# Patient Record
Sex: Female | Born: 1985 | Race: Black or African American | Hispanic: No | Marital: Single | Smoking: Current every day smoker
Health system: Southern US, Community
[De-identification: ages and names within clinical notes are randomized; demographics above are authoritative.]

---

## 2013-09-07 ENCOUNTER — Encounter (HOSPITAL_BASED_OUTPATIENT_CLINIC_OR_DEPARTMENT_OTHER): Payer: Self-pay | Admitting: Emergency Medicine

## 2013-09-07 ENCOUNTER — Emergency Department (HOSPITAL_BASED_OUTPATIENT_CLINIC_OR_DEPARTMENT_OTHER)
Admission: EM | Admit: 2013-09-07 | Discharge: 2013-09-07 | Disposition: A | Discharge status: Home / Self Care | Payer: BC Managed Care – PPO | Attending: Emergency Medicine | Admitting: Emergency Medicine

## 2013-09-07 DIAGNOSIS — B349 Viral infection, unspecified: Secondary | ICD-10-CM

## 2013-09-07 DIAGNOSIS — J029 Acute pharyngitis, unspecified: Secondary | ICD-10-CM

## 2013-09-07 DIAGNOSIS — B9789 Other viral agents as the cause of diseases classified elsewhere: Secondary | ICD-10-CM | POA: Insufficient documentation

## 2013-09-07 LAB — RAPID STREP SCREEN (MED CTR MEBANE ONLY): STREPTOCOCCUS, GROUP A SCREEN (DIRECT): NEGATIVE

## 2013-09-07 MED ORDER — IBUPROFEN 600 MG PO TABS: 600.0000 mg | ORAL_TABLET | Freq: Four times a day (QID) | ORAL | Status: DC | PRN

## 2013-09-07 MED ORDER — GUAIFENESIN-CODEINE 100-10 MG/5ML PO SYRP: 5.0000 mL | ORAL_SOLUTION | Freq: Three times a day (TID) | ORAL | Status: DC | PRN

## 2013-09-07 NOTE — ED Notes (Addendum)
Sore throat since yesterday.

## 2013-09-07 NOTE — ED Provider Notes (Signed)
CSN: [id number]     Arrival date & time 09/07/13  1539 History   First MD Initiated Contact with Patient 09/07/13 1626     Chief Complaint  Patient presents with  . Sore Throat     (Consider location/radiation/quality/duration/timing/severity/associated sxs/prior Treatment) Patient is a 28 y.o. female presenting with pharyngitis. The history is provided by the patient.  Sore Throat This is a new problem. The current episode started yesterday. The problem occurs constantly. The problem has been gradually worsening. Associated symptoms include congestion, coughing, a sore throat and swollen glands. Pertinent negatives include no abdominal pain, chills, fatigue, fever, nausea, rash, urinary symptoms, vertigo, visual change or vomiting. The symptoms are aggravated by coughing. Treatments tried: OTC cough and cold medication. The treatment provided moderate relief.   Stacy Franklin is a 28 y.o. female who presents to the ED with sore throat, congestion and cough that started yesterday. She has taken Benadryl, Claritin and cough medication. Every thing got better except the sore throat.    History reviewed. No pertinent past medical history. History reviewed. No pertinent past surgical history. No family history on file. History  Substance Use Topics  . Smoking status: Never Smoker   . Smokeless tobacco: Not on file  . Alcohol Use: No   OB History   Grav Para Term Preterm Abortions TAB SAB Ect Mult Living                 Review of Systems  Constitutional: Negative for fever, chills and fatigue.  HENT: Positive for congestion, sinus pressure and sore throat. Negative for ear pain.   Respiratory: Positive for cough. Negative for wheezing.   Gastrointestinal: Negative for nausea, vomiting and abdominal pain.  Genitourinary: Negative for dysuria, urgency and frequency.  Musculoskeletal: Negative for back pain.  Skin: Negative for rash.  Neurological: Negative for vertigo, syncope and  light-headedness.  Psychiatric/Behavioral: Negative for confusion. The patient is not nervous/anxious.       Allergies  Review of patient's allergies indicates no known allergies.  Home Medications   Prior to Admission medications   Not on File   BP 125/66  Pulse 86  Temp(Src) 98.1 F (36.7 C) (Oral)  Resp 18  Ht 5\' 6"  (1.676 m)  Wt 252 lb (114.306 kg)  BMI 40.69 kg/m2  SpO2 99%  LMP 08/27/2013 Physical Exam  Nursing note and vitals reviewed. Constitutional: She is oriented to person, place, and time. She appears well-developed and well-nourished.  HENT:  Head: Normocephalic.  Right Ear: Tympanic membrane normal.  Left Ear: Tympanic membrane normal.  Nose: Nose normal.  Mouth/Throat: Uvula is midline and mucous membranes are normal. Posterior oropharyngeal erythema present.  Eyes: Conjunctivae and EOM are normal.  Neck: Neck supple.  Cardiovascular: Normal rate and regular rhythm.   Pulmonary/Chest: Effort normal and breath sounds normal.  Abdominal: Soft. There is no tenderness.  Musculoskeletal: Normal range of motion.  Lymphadenopathy:    She has no cervical adenopathy.  Neurological: She is alert and oriented to person, place, and time. No cranial nerve deficit.  Skin: Skin is warm and dry.  Psychiatric: She has a normal mood and affect. Her behavior is normal.    ED Course  Procedures (including critical care time) Labs Review Results for orders placed during the hospital encounter of 09/07/13 (from the past 24 hour(s))  RAPID STREP SCREEN     Status: None   Collection Time    09/07/13  3:45 PM      Result Value Ref  Range   Streptococcus, Group A Screen (Direct) NEGATIVE  NEGATIVE     MDM  28 y.o. female with sore throat that started yesterday. No fever or other problems today. Rapid strep negative. Will treat as viral pharyngitis. She will follow up with her PCP or return here as needed.  Discussed with the patient and all questioned fully answered.      [facility] Bunnie Pion, [place] 09/08/13 [phone]

## 2013-09-09 LAB — CULTURE, GROUP A STREP

## 2013-09-10 NOTE — ED Provider Notes (Signed)
Medical screening examination/treatment/procedure(s) were performed by non-physician practitioner and as supervising physician I was immediately available for consultation/collaboration.   EKG Interpretation None        Delice Bison Dominie Benedick, DO 09/10/13 [phone]

## 2013-10-28 ENCOUNTER — Ambulatory Visit
Admission: RE | Admit: 2013-10-28 | Discharge: 2013-10-28 | Disposition: A | Discharge status: Home / Self Care | Payer: BC Managed Care – PPO | Source: Ambulatory Visit | Attending: Family Medicine | Admitting: Family Medicine

## 2013-10-28 ENCOUNTER — Other Ambulatory Visit: Payer: Self-pay | Admitting: Family Medicine

## 2013-10-28 DIAGNOSIS — M25561 Pain in right knee: Secondary | ICD-10-CM

## 2015-12-03 IMAGING — CR DG KNEE 1-2V*R*
2 series · 2 of 2 positions shown · non-contrast
Comparison: None.

CLINICAL DATA: Right knee pain, accident 1 year ago, episodic pain

EXAM:
RIGHT KNEE - 1-2 VIEW

[view not recorded (1 of 2)]
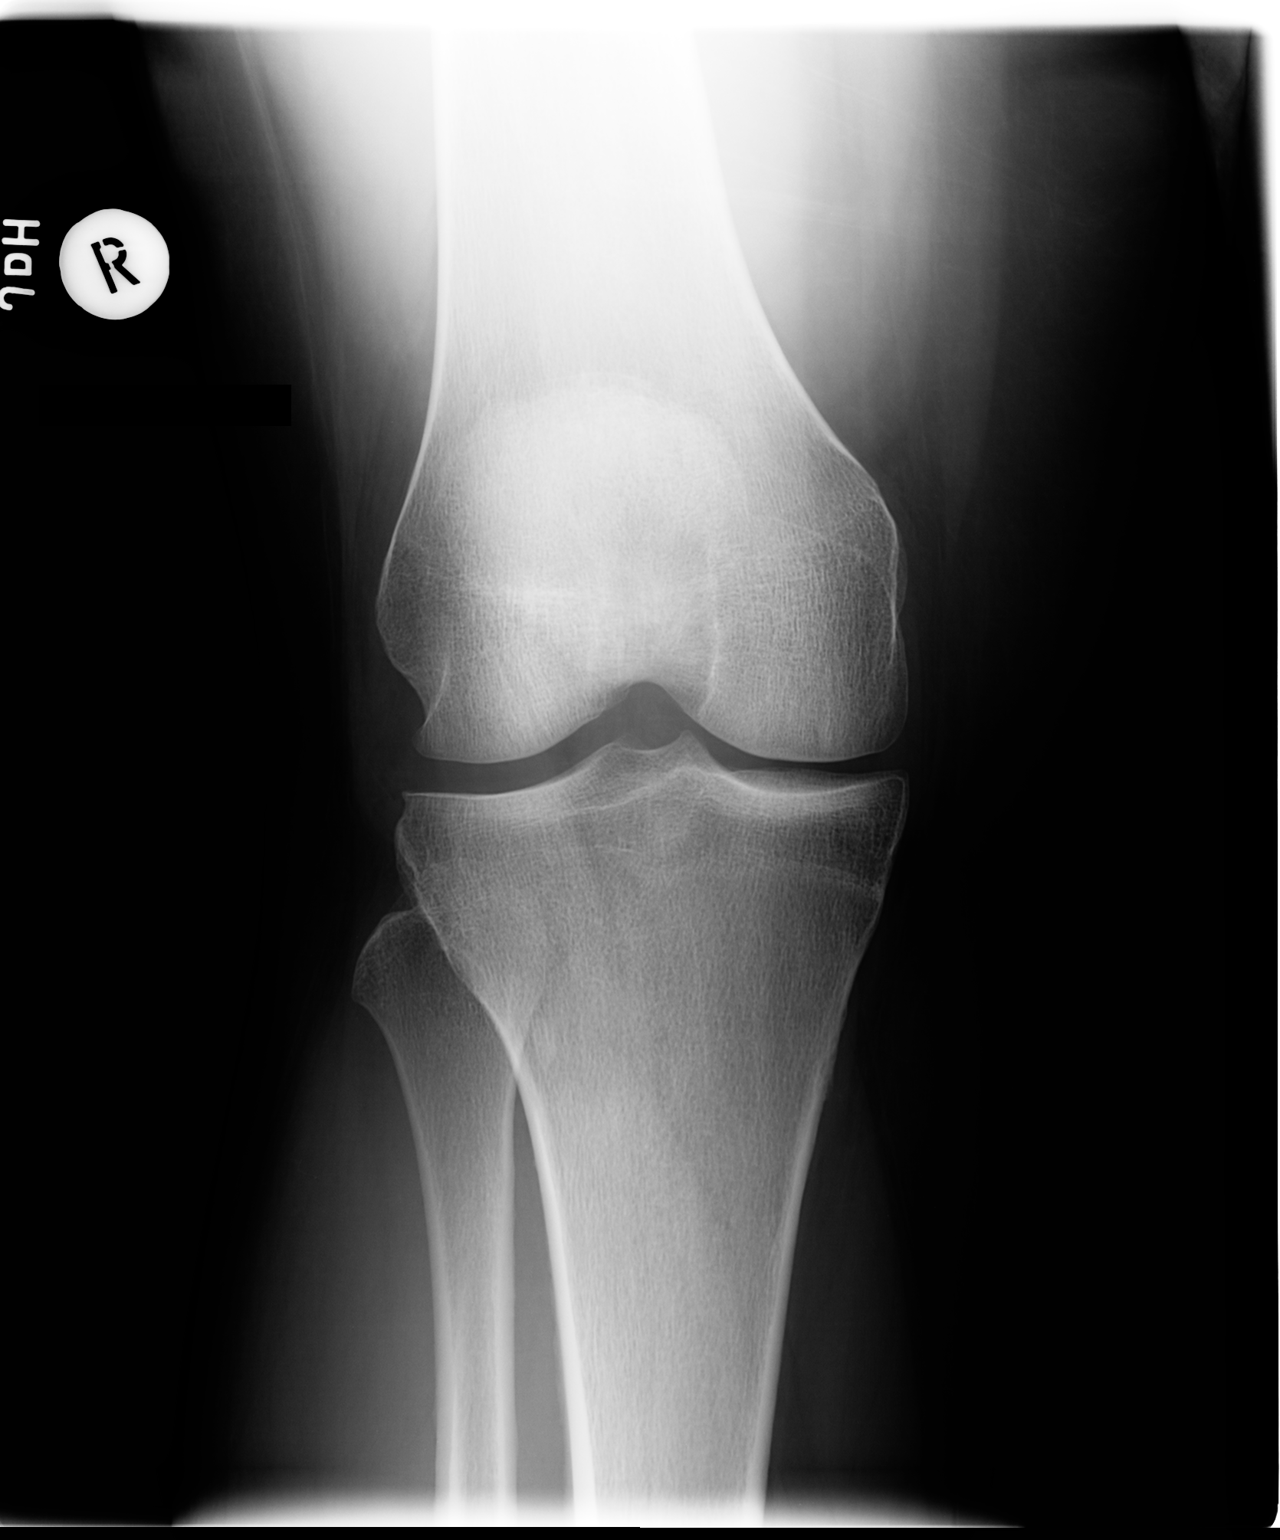

[view not recorded (2 of 2)]
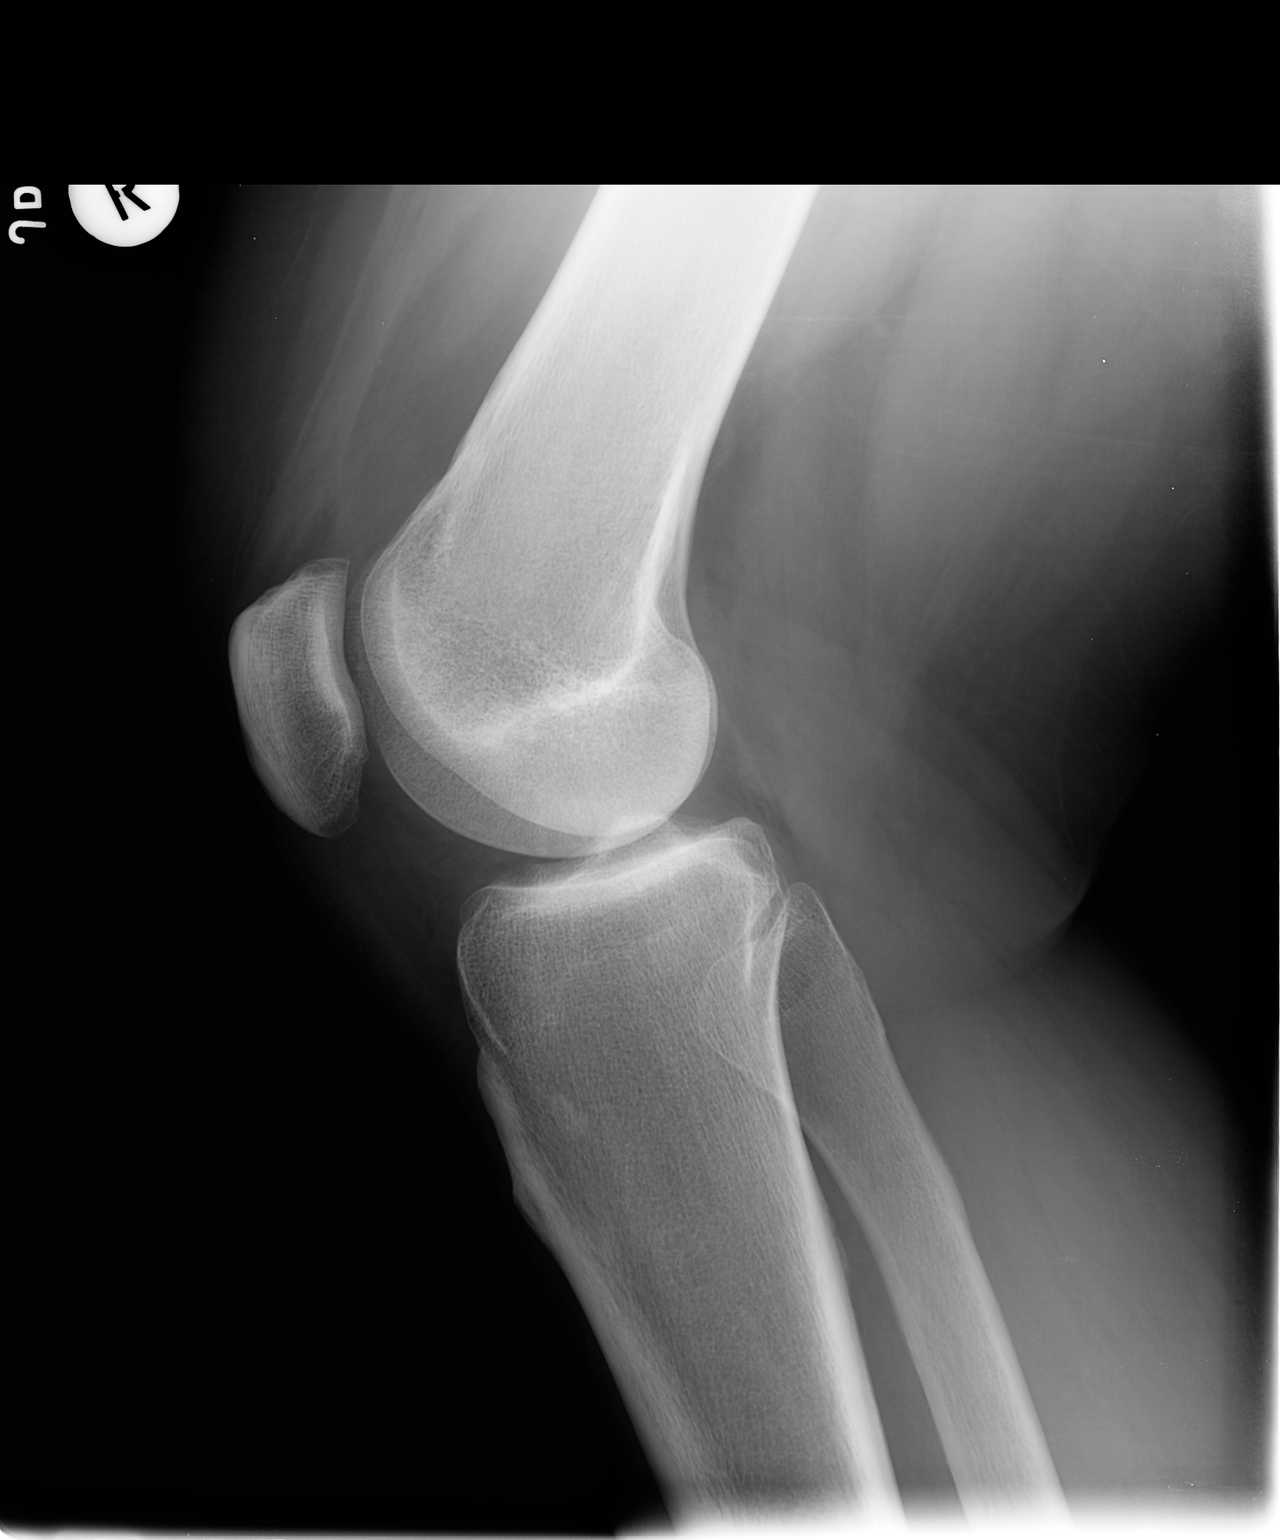

[2 of 2 positions shown; findings below may reference images not displayed]

FINDINGS: Two views of the right knee submitted. No acute fracture or
subluxation. No radiopaque foreign body. No joint effusion.
IMPRESSION: Negative.

## 2019-11-30 DIAGNOSIS — Z130 Encounter for screening for diseases of the blood and blood-forming organs and certain disorders involving the immune mechanism: Secondary | ICD-10-CM | POA: Diagnosis not present

## 2019-11-30 DIAGNOSIS — Z118 Encounter for screening for other infectious and parasitic diseases: Secondary | ICD-10-CM | POA: Diagnosis not present

## 2019-11-30 DIAGNOSIS — Z131 Encounter for screening for diabetes mellitus: Secondary | ICD-10-CM | POA: Diagnosis not present

## 2019-11-30 DIAGNOSIS — Z6841 Body Mass Index (BMI) 40.0 and over, adult: Secondary | ICD-10-CM | POA: Diagnosis not present

## 2019-11-30 DIAGNOSIS — Z1159 Encounter for screening for other viral diseases: Secondary | ICD-10-CM | POA: Diagnosis not present

## 2019-11-30 DIAGNOSIS — Z1151 Encounter for screening for human papillomavirus (HPV): Secondary | ICD-10-CM | POA: Diagnosis not present

## 2019-11-30 DIAGNOSIS — Z113 Encounter for screening for infections with a predominantly sexual mode of transmission: Secondary | ICD-10-CM | POA: Diagnosis not present

## 2019-11-30 DIAGNOSIS — Z114 Encounter for screening for human immunodeficiency virus [HIV]: Secondary | ICD-10-CM | POA: Diagnosis not present

## 2019-11-30 DIAGNOSIS — Z01419 Encounter for gynecological examination (general) (routine) without abnormal findings: Secondary | ICD-10-CM | POA: Diagnosis not present

## 2019-11-30 DIAGNOSIS — Z3169 Encounter for other general counseling and advice on procreation: Secondary | ICD-10-CM | POA: Diagnosis not present

## 2019-11-30 DIAGNOSIS — Z01411 Encounter for gynecological examination (general) (routine) with abnormal findings: Secondary | ICD-10-CM | POA: Diagnosis not present

## 2019-11-30 DIAGNOSIS — Z1329 Encounter for screening for other suspected endocrine disorder: Secondary | ICD-10-CM | POA: Diagnosis not present

## 2019-11-30 DIAGNOSIS — D259 Leiomyoma of uterus, unspecified: Secondary | ICD-10-CM | POA: Diagnosis not present

## 2019-12-10 DIAGNOSIS — Z13220 Encounter for screening for lipoid disorders: Secondary | ICD-10-CM | POA: Diagnosis not present

## 2019-12-10 DIAGNOSIS — Z0000 Encounter for general adult medical examination without abnormal findings: Secondary | ICD-10-CM | POA: Diagnosis not present

## 2019-12-25 DIAGNOSIS — D259 Leiomyoma of uterus, unspecified: Secondary | ICD-10-CM | POA: Diagnosis not present

## 2020-03-24 DIAGNOSIS — Z6841 Body Mass Index (BMI) 40.0 and over, adult: Secondary | ICD-10-CM | POA: Diagnosis not present

## 2020-10-01 ENCOUNTER — Encounter (HOSPITAL_BASED_OUTPATIENT_CLINIC_OR_DEPARTMENT_OTHER): Payer: Self-pay | Admitting: Emergency Medicine

## 2020-10-01 ENCOUNTER — Emergency Department (HOSPITAL_BASED_OUTPATIENT_CLINIC_OR_DEPARTMENT_OTHER): Payer: 59

## 2020-10-01 ENCOUNTER — Other Ambulatory Visit: Payer: Self-pay

## 2020-10-01 ENCOUNTER — Emergency Department (HOSPITAL_BASED_OUTPATIENT_CLINIC_OR_DEPARTMENT_OTHER)
Admission: EM | Admit: 2020-10-01 | Discharge: 2020-10-01 | Disposition: A | Discharge status: Home / Self Care | Payer: 59 | Attending: Emergency Medicine | Admitting: Emergency Medicine

## 2020-10-01 DIAGNOSIS — F17210 Nicotine dependence, cigarettes, uncomplicated: Secondary | ICD-10-CM | POA: Insufficient documentation

## 2020-10-01 DIAGNOSIS — R102 Pelvic and perineal pain: Secondary | ICD-10-CM | POA: Diagnosis present

## 2020-10-01 DIAGNOSIS — D259 Leiomyoma of uterus, unspecified: Secondary | ICD-10-CM | POA: Insufficient documentation

## 2020-10-01 LAB — URINALYSIS, ROUTINE W REFLEX MICROSCOPIC
Bilirubin Urine: NEGATIVE
Glucose, UA: NEGATIVE mg/dL
Ketones, ur: NEGATIVE mg/dL
Leukocytes,Ua: NEGATIVE
Nitrite: NEGATIVE
Protein, ur: NEGATIVE mg/dL
Specific Gravity, Urine: 1.025 (ref 1.005–1.030)
pH: 5.5 (ref 5.0–8.0)

## 2020-10-01 LAB — COMPREHENSIVE METABOLIC PANEL
ALT: 18 U/L (ref 0–44)
AST: 19 U/L (ref 15–41)
Albumin: 4 g/dL (ref 3.5–5.0)
Alkaline Phosphatase: 124 U/L (ref 38–126)
Anion gap: 9 (ref 5–15)
BUN: 12 mg/dL (ref 6–20)
CO2: 25 mmol/L (ref 22–32)
Calcium: 8.9 mg/dL (ref 8.9–10.3)
Chloride: 105 mmol/L (ref 98–111)
Creatinine, Ser: 0.82 mg/dL (ref 0.44–1.00)
GFR, Estimated: >60 mL/min (ref >60)
Glucose, Bld: 99 mg/dL (ref 70–99)
Potassium: 3.9 mmol/L (ref 3.5–5.1)
Sodium: 139 mmol/L (ref 135–145)
Total Bilirubin: 0.3 mg/dL (ref 0.3–1.2)
Total Protein: 8 g/dL (ref 6.5–8.1)

## 2020-10-01 LAB — CBC WITH DIFFERENTIAL/PLATELET
Abs Immature Granulocytes: 0.01 10*3/uL (ref 0.00–0.07)
Basophils Absolute: 0 10*3/uL (ref 0.0–0.1)
Basophils Relative: 0 %
Eosinophils Absolute: 0.1 10*3/uL (ref 0.0–0.5)
Eosinophils Relative: 1 %
HCT: 40.6 % (ref 36.0–46.0)
Hemoglobin: 12.6 g/dL (ref 12.0–15.0)
Immature Granulocytes: 0 %
Lymphocytes Relative: 28 %
Lymphs Abs: 2 10*3/uL (ref 0.7–4.0)
MCH: 26.5 pg (ref 26.0–34.0)
MCHC: 31 g/dL (ref 30.0–36.0)
MCV: 85.3 fL (ref 80.0–100.0)
Monocytes Absolute: 0.5 10*3/uL (ref 0.1–1.0)
Monocytes Relative: 6 %
Neutro Abs: 4.6 10*3/uL (ref 1.7–7.7)
Neutrophils Relative %: 65 %
Platelets: 371 10*3/uL (ref 150–400)
RBC: 4.76 MIL/uL (ref 3.87–5.11)
RDW: 15.4 % (ref 11.5–15.5)
WBC: 7.2 10*3/uL (ref 4.0–10.5)
nRBC: 0 % (ref 0.0–0.2)

## 2020-10-01 LAB — HCG, QUANTITATIVE, PREGNANCY: hCG, Beta Chain, Quant, S: <1 m[IU]/mL (ref <5)

## 2020-10-01 LAB — URINALYSIS, MICROSCOPIC (REFLEX)

## 2020-10-01 MED ORDER — MORPHINE SULFATE (PF) 4 MG/ML IV SOLN
4.0000 mg | Freq: Once | INTRAVENOUS | Status: AC
  Administered 2020-10-01: 4 mg via INTRAVENOUS
  Filled 2020-10-01: qty 1

## 2020-10-01 MED ORDER — SODIUM CHLORIDE 0.9 % IV BOLUS
1000.0000 mL | Freq: Once | INTRAVENOUS | Status: AC
  Administered 2020-10-01: 1000 mL via INTRAVENOUS

## 2020-10-01 MED ORDER — METHOCARBAMOL 500 MG PO TABS: 500.0000 mg | ORAL_TABLET | Freq: Two times a day (BID) | ORAL | 0 refills | Status: DC

## 2020-10-01 MED ORDER — HYDROCODONE-ACETAMINOPHEN 5-325 MG PO TABS
1.0000 | ORAL_TABLET | Freq: Once | ORAL | Status: AC
  Administered 2020-10-01: 1 via ORAL
  Filled 2020-10-01: qty 1

## 2020-10-01 MED ORDER — KETOROLAC TROMETHAMINE 30 MG/ML IJ SOLN
30.0000 mg | Freq: Once | INTRAMUSCULAR | Status: AC
  Administered 2020-10-01: 30 mg via INTRAVENOUS
  Filled 2020-10-01: qty 1

## 2020-10-01 MED ORDER — NAPROXEN 500 MG PO TABS: 500.0000 mg | ORAL_TABLET | Freq: Two times a day (BID) | ORAL | 0 refills | Status: DC

## 2020-10-01 MED ORDER — ACETAMINOPHEN 325 MG PO TABS
650.0000 mg | ORAL_TABLET | Freq: Once | ORAL | Status: AC
  Administered 2020-10-01: 650 mg via ORAL
  Filled 2020-10-01: qty 2

## 2020-10-01 MED ORDER — IOHEXOL 300 MG/ML  SOLN
100.0000 mL | Freq: Once | INTRAMUSCULAR | Status: AC | PRN
  Administered 2020-10-01: 100 mL via INTRAVENOUS

## 2020-10-01 NOTE — ED Provider Notes (Signed)
[place] EMERGENCY DEPARTMENT Provider Note   CSN: [id number] Arrival date & time: 10/01/20  1315     History Chief Complaint  Patient presents with   Abdominal Pain    Stacy Franklin is a 35 y.o. female with a past medical history of uterine fibroids presenting to the ED for continued pelvic pain.  Symptoms have been going on for the past 3 to 4 days.  Was evaluated by her OB/GYN with pelvic exam, GC chlamydia testing, transvaginal and transabdominal ultrasound done yesterday.  She was told that she may have an STD and was given Percocet, doxycycline and Flagyl.  She was told that the results of the STD testing would come back in a few days.  States that she took the Percocet today but continues to have pain.  She presents to the ER for continued same pain for the past 3 to 4 days.  Pain is in the lower abdomen mostly on the left side and radiates to the side. She thought that she was constipated but tried using a Dulcolax suppository with improvement.  Denies any vaginal discharge, abnormal vaginal bleeding, fever, vomiting.  HPI     History reviewed. No pertinent past medical history.  There are no problems to display for this patient.   History reviewed. No pertinent surgical history.   OB History   No obstetric history on file.     No family history on file.  Social History   Tobacco Use   Smoking status: Some Days    Pack years: 0.00    Types: Cigarettes   Smokeless tobacco: Never  Substance Use Topics   Alcohol use: No   Drug use: No    Home Medications Prior to Admission medications   Medication Sig Start Date End Date Taking? Authorizing Provider  methocarbamol (ROBAXIN) 500 MG tablet Take 1 tablet (500 mg total) by mouth 2 (two) times daily. 10/01/20  Yes Tameaka Eichhorn, PA-C  naproxen (NAPROSYN) 500 MG tablet Take 1 tablet (500 mg total) by mouth 2 (two) times daily. 10/01/20  Yes Zakaria Sedor, PA-C  guaiFENesin-codeine (GUAIFENESIN AC) 100-10 MG/5ML  syrup Take 5 mLs by mouth 3 (three) times daily as needed for cough. 09/07/13   Ashley Murrain, NP  ibuprofen (ADVIL,MOTRIN) 600 MG tablet Take 1 tablet (600 mg total) by mouth every 6 (six) hours as needed. 09/07/13   Ashley Murrain, NP    Allergies    Oxycodone and Penicillin g  Review of Systems   Review of Systems  Constitutional:  Negative for appetite change, chills and fever.  HENT:  Negative for ear pain, rhinorrhea, sneezing and sore throat.   Eyes:  Negative for photophobia and visual disturbance.  Respiratory:  Negative for cough, chest tightness, shortness of breath and wheezing.   Cardiovascular:  Negative for chest pain and palpitations.  Gastrointestinal:  Negative for abdominal pain, blood in stool, constipation, diarrhea, nausea and vomiting.  Genitourinary:  Positive for pelvic pain. Negative for dysuria, hematuria and urgency.  Musculoskeletal:  Negative for myalgias.  Skin:  Negative for rash.  Neurological:  Negative for dizziness, weakness and light-headedness.   Physical Exam Updated Vital Signs BP 130/66 (BP Location: Right Arm)   Pulse 79   Temp 98.6 F (37 C) (Oral)   Resp 18   Ht 5\' 8"  (1.727 m)   Wt (!) 145.2 kg   LMP 10/01/2020 (Exact Date)   SpO2 100%   BMI 48.66 kg/m   Physical Exam Vitals and  nursing note reviewed.  Constitutional:      General: She is not in acute distress.    Appearance: She is well-developed.  HENT:     Head: Normocephalic and atraumatic.     Nose: Nose normal.  Eyes:     General: No scleral icterus.       Left eye: No discharge.     Conjunctiva/sclera: Conjunctivae normal.  Cardiovascular:     Rate and Rhythm: Normal rate and regular rhythm.     Heart sounds: Normal heart sounds. No murmur heard.   No friction rub. No gallop.  Pulmonary:     Effort: Pulmonary effort is normal. No respiratory distress.     Breath sounds: Normal breath sounds.  Abdominal:     General: Bowel sounds are normal. There is no distension.      Palpations: Abdomen is soft.     Tenderness: There is abdominal tenderness in the suprapubic area and left lower quadrant. There is no guarding.  Musculoskeletal:        General: Normal range of motion.     Cervical back: Normal range of motion and neck supple.  Skin:    General: Skin is warm and dry.     Findings: No rash.  Neurological:     Mental Status: She is alert.     Motor: No abnormal muscle tone.     Coordination: Coordination normal.    ED Results / Procedures / Treatments   Labs (all labs ordered are listed, but only abnormal results are displayed) Labs Reviewed  URINALYSIS, ROUTINE W REFLEX MICROSCOPIC - Abnormal; Notable for the following components:      Result Value   Hgb urine dipstick MODERATE (*)    All other components within normal limits  URINALYSIS, MICROSCOPIC (REFLEX) - Abnormal; Notable for the following components:   Bacteria, UA FEW (*)    All other components within normal limits  CBC WITH DIFFERENTIAL/PLATELET  COMPREHENSIVE METABOLIC PANEL  HCG, QUANTITATIVE, PREGNANCY    EKG None  Radiology CT ABDOMEN PELVIS W CONTRAST  Result Date: 10/01/2020 CLINICAL DATA:  Left lower quadrant abdominal pain. EXAM: CT ABDOMEN AND PELVIS WITH CONTRAST TECHNIQUE: Multidetector CT imaging of the abdomen and pelvis was performed using the standard protocol following bolus administration of intravenous contrast. CONTRAST:  11mL OMNIPAQUE IOHEXOL 300 MG/ML  SOLN COMPARISON:  None. FINDINGS: Lower chest: No acute abnormality. Hepatobiliary: No focal liver abnormality is seen. No gallstones, gallbladder wall thickening, or biliary dilatation. Pancreas: Unremarkable. No pancreatic ductal dilatation or surrounding inflammatory changes. Spleen: Normal in size without focal abnormality. Adrenals/Urinary Tract: Normal adrenal glands. No hydronephrosis, kidney stone, or kidney mass identified bilaterally. Urinary bladder appears collapsed. Stomach/Bowel: Stomach is within  normal limits. Appendix appears normal. No evidence of bowel wall thickening, distention, or inflammatory changes. Vascular/Lymphatic: No significant vascular findings are present. No enlarged abdominal or pelvic lymph nodes. Reproductive: There is an enlarged uterus containing multiple fibroids. The uterus measures 15.8 x 11.8 by 9.4 cm (volume = 920 cm^3). The dominant fibroid has a transverse diameter of 9.4 cm. Within the posterior myometrium there is a low-attenuation fibroid measuring 5.2 cm, image 76/2. Suspicious for degenerating fibroid. Partially exophytic subserosal fibroid arises off the right lateral myometrium measuring 5.8 cm. Other: Trace free fluid noted within the cul-de-sac. No focal fluid collections. Musculoskeletal: No acute or significant osseous findings. L5-S1 degenerative disc disease. IMPRESSION: 1. Enlarged fibroid uterus with a volume of approximately 920 cc. Low-attenuation fibroid within the posterior myometrium may represent  a degenerating fibroid. Degenerating fibroids may be a source of acute pelvic pain. Clinical correlation advised. 2. No signs of acute diverticulitis. 3. Enlarged uterus containing multiple fibroids. Electronically Signed   By: Kerby Moors M.D.   On: 10/01/2020 16:59    Procedures Procedures   Medications Ordered in ED Medications  sodium chloride 0.9 % bolus 1,000 mL ( Intravenous Stopped 10/01/20 1516)  morphine 4 MG/ML injection 4 mg (4 mg Intravenous Given 10/01/20 1415)  iohexol (OMNIPAQUE) 300 MG/ML solution 100 mL (100 mLs Intravenous Contrast Given 10/01/20 1623)  ketorolac (TORADOL) 30 MG/ML injection 30 mg (30 mg Intravenous Given 10/01/20 1729)  acetaminophen (TYLENOL) tablet 650 mg (650 mg Oral Given 10/01/20 1727)  HYDROcodone-acetaminophen (NORCO/VICODIN) 5-325 MG per tablet 1 tablet (1 tablet Oral Given 10/01/20 1728)    ED Course  I have reviewed the triage vital signs and the nursing notes.  Pertinent labs & imaging results that were  available during my care of the patient were reviewed by me and considered in my medical decision making (see chart for details).  Clinical Course as of 10/01/20 1750  Sat Oct 01, 2020  1501 WBC: 7.2 [HK]  1552 HCG, Beta Chain, Quant, S: <1 [HK]    Clinical Course User Index [HK] Delia Heady, PA-C   MDM Rules/Calculators/A&P                          35yo female presenting to the ED for continued pelvic pain for the past 3 to 4 days.  She saw her OB/GYN yesterday and had ultrasound, pelvic exam and swabs done.  She was given Percocet, Flagyl and doxycycline but continues to have pain.  Pain is in her left lower quadrant and radiates to the side.  She was told that she has uterine fibroids.  She thought she was constipated so she used a Dulcolax suppository and had a bowel movement.  Denies any vomiting, urinary symptoms, abnormal vaginal bleeding or vaginal discharge.  On exam there is tenderness palpation of the lower abdomen without rebound or guarding.  Had a discussion with the patient regarding today's work-up.  She declines pelvic exam but asked if the STD testing that she gets here will result faster than 1 to 2 days but she was told at her OB/GYN office. I told her it would not.  I do not see any indication to repeat an ultrasound at this time as she just had 1 yesterday for the same symptoms.  Informed her that there are other avenues to explore as far as what could be causing her pain other than from a pelvic standpoint.  We will start with lab work and treat symptomatically and reassess.  Lab work including CBC, CMP and hCG unremarkable.  CT scan of abdomen pelvis shows findings consistent with uterine fibroids.  There may be a fibroid that is degenerating.  No other acute findings at this time.  I suspect this is the cause of her symptoms.  I feel reassured that she was evaluated by OB/GYN yesterday.  I had a long discussion with the patient regarding the likely cause of her pain.  She is  somewhat frustrated because she states that ibuprofen has not helped her.  She initially told me that she did take Percocet today that was prescribed to her from her OB/GYN but then told me it was either from her mother or father.  I do not feel comfortable prescribing opioid medication at this  time as I feel that NSAIDs will be more helpful.  I urged her to consistently take the naproxen that I have prescribed.  We will add on a muscle relaxer as well.  Urinalysis without evidence of infection.  Return precautions given.   Patient is hemodynamically stable, in NAD, and able to ambulate in the ED. Evaluation does not show pathology that would require ongoing emergent intervention or inpatient treatment. I explained the diagnosis to the patient. Pain has been managed and has no complaints prior to discharge. Patient is comfortable with above plan and is stable for discharge at this time. All questions were answered prior to disposition. Strict return precautions for returning to the ED were discussed. Encouraged follow up with PCP.   An After Visit Summary was printed and given to the patient.   Portions of this note were generated with [employer]. Dictation errors may occur despite best attempts at proofreading.  Final Clinical Impression(s) / ED Diagnoses Final diagnoses:  Uterine leiomyoma, unspecified location    Rx / DC Orders ED Discharge Orders          Ordered    naproxen (NAPROSYN) 500 MG tablet  2 times daily        10/01/20 1747    methocarbamol (ROBAXIN) 500 MG tablet  2 times daily        10/01/20 1747             Delia Heady, PA-C 10/01/20 1750    Blanchie Dessert, MD 10/02/20 [phone]

## 2020-10-01 NOTE — Discharge Instructions (Addendum)
Your CT scan today shows that you have uterine fibroids. The size of this could cause the acute pain, as well as fluctuations in its blood supply. Take the following medications to help with your symptoms. It is important for you to follow-up with your OB/GYN as able ultimately treat this condition.  Return to the ER if you start to develop a fever, worsening pain, abnormal bleeding.

## 2020-10-01 NOTE — ED Notes (Signed)
Pt c/o increased pain, asking for more meds. RN made aware.

## 2020-10-01 NOTE — ED Notes (Signed)
Pt provided discharge instructions and prescription information. Pt was given the opportunity to ask questions and questions were answered. Discharge signature not obtained in the setting of the COVID-19 pandemic in order to reduce high touch surfaces.   Pt teaching provided on medications that may cause drowsiness. Pt instructed not to drive or operate heavy machinery while taking the prescribed medication. Pt verbalized understanding.

## 2020-10-01 NOTE — ED Notes (Signed)
Patient transported to CT. Ambulated with steady gait, NAD

## 2020-10-01 NOTE — ED Notes (Signed)
Pt endorses concern for STD

## 2020-10-01 NOTE — ED Notes (Signed)
Spoke with Arbie Cookey in lab to add on quant hCG preg

## 2020-10-01 NOTE — ED Notes (Signed)
Pt refused w/c; ambulated to room

## 2020-10-01 NOTE — ED Triage Notes (Signed)
Pt reports pelvic pain w/ hx of fibroids; was seen by ob-gyn yesterday [facility]) and rx'd abx

## 2022-08-24 ENCOUNTER — Other Ambulatory Visit: Payer: Self-pay | Admitting: Obstetrics & Gynecology

## 2022-08-30 NOTE — Pre-Procedure Instructions (Signed)
Surgical Instructions    Your procedure is scheduled on Thursday, June 6.  Report to [facility] Main Entrance "A" at 11:15 A.M., then check in with the Admitting office.  Call this number if you have problems the morning of surgery:  [phone]   If you have any questions prior to your surgery date call [phone]: Open Monday-Friday 8am-4pm If you experience any cold or flu symptoms such as cough, fever, chills, shortness of breath, etc. between now and your scheduled surgery, please notify us at the above number     Remember:  Do not eat after midnight the night before your surgery  You may drink clear liquids until 10:15AM the morning of your surgery.   Clear liquids allowed are: Water, Non-Citrus Juices (without pulp), Carbonated Beverages, Clear Tea, Black Coffee ONLY (NO MILK, CREAM OR POWDERED CREAMER of any kind), and Gatorade    Take these medicines the morning of surgery with A SIP OF WATER: NONE   As of today, STOP taking any Aspirin (unless otherwise instructed by your surgeon) Aleve, Naproxen, Ibuprofen, Motrin, Advil, Goody's, BC's, all herbal medications, fish oil, and all vitamins.            [facility] is not responsible for any belongings or valuables.    Do NOT Smoke (Tobacco/Vaping)  24 hours prior to your procedure  If you use a CPAP at night, you may bring your mask for your overnight stay.   Contacts, glasses, hearing aids, dentures or partials may not be worn into surgery, please bring cases for these belongings   For patients admitted to the hospital, discharge time will be determined by your treatment team.   Patients discharged the day of surgery will not be allowed to drive home, and someone needs to stay with them for 24 hours.   SURGICAL WAITING ROOM VISITATION Patients having surgery or a procedure may have no more than 2 support people in the waiting area - these visitors may rotate.   Children under the age of 34 must have an adult with  them who is not the patient. If the patient needs to stay at the hospital during part of their recovery, the visitor guidelines for inpatient rooms apply. Pre-op nurse will coordinate an appropriate time for 1 support person to accompany patient in pre-op.  This support person may not rotate.   Please refer to [email or web address] for the visitor guidelines for Inpatients (after your surgery is over and you are in a regular room).    Special instructions:    Oral Hygiene is also important to reduce your risk of infection.  Remember - BRUSH YOUR TEETH THE MORNING OF SURGERY WITH YOUR REGULAR TOOTHPASTE   [facility]- Preparing For Surgery  Before surgery, you can play an important role. Because skin is not sterile, your skin needs to be as free of germs as possible. You can reduce the number of germs on your skin by washing with CHG (chlorahexidine gluconate) Soap before surgery.  CHG is an antiseptic cleaner which kills germs and bonds with the skin to continue killing germs even after washing.     Please do not use if you have an allergy to CHG or antibacterial soaps. If your skin becomes reddened/irritated stop using the CHG.  Do not shave (including legs and underarms) for at least 48 hours prior to first CHG shower. It is OK to shave your face.  Please follow these instructions carefully.     Shower the [employer] BEFORE SURGERY  and the MORNING OF SURGERY with CHG Soap.   If you chose to wash your hair, wash your hair first as usual with your normal shampoo. After you shampoo, rinse your hair and body thoroughly to remove the shampoo.  Then [employer] and genitals (private parts) with your normal soap and rinse thoroughly to remove soap.  After that Use CHG Soap as you would any other liquid soap. You can apply CHG directly to the skin and wash gently with a scrungie or a clean washcloth.   Apply the CHG Soap to your body ONLY FROM THE NECK  DOWN.  Do not use on open wounds or open sores. Avoid contact with your eyes, ears, mouth and genitals (private parts). Wash Face and genitals (private parts)  with your normal soap.   Wash thoroughly, paying special attention to the area where your surgery will be performed.  Thoroughly rinse your body with warm water from the neck down.  DO NOT shower/wash with your normal soap after using and rinsing off the CHG Soap.  Pat yourself dry with a CLEAN TOWEL.  Wear CLEAN PAJAMAS to bed the night before surgery  Place CLEAN SHEETS on your bed the night before your surgery  DO NOT SLEEP WITH PETS.   Day of Surgery:  Take a shower with CHG soap. Wear Clean/Comfortable clothing the morning of surgery Do not wear jewelry or makeup. Do not wear lotions, powders, perfumes/cologne or deodorant. Do not shave 48 hours prior to surgery.  Men may shave face and neck. Do not bring valuables to the hospital. Do not wear nail polish, gel polish, artificial nails, or any other type of covering on natural nails (fingers and toes) If you have artificial nails or gel coating that need to be removed by a nail salon, please have this removed prior to surgery. Artificial nails or gel coating may interfere with anesthesia's ability to adequately monitor your vital signs. Remember to brush your teeth WITH YOUR REGULAR TOOTHPASTE.  *Please be ready to give a urine sample once you arrive to the pre-op area*  If you received a COVID test during your pre-op visit, it is requested that you wear a mask when out in public, stay away from anyone that may not be feeling well, and notify your surgeon if you develop symptoms. If you have been in contact with anyone that has tested positive in the last 10 days, please notify your surgeon.    Please read over the following fact sheets that you were given.

## 2022-08-31 ENCOUNTER — Other Ambulatory Visit: Payer: Self-pay

## 2022-08-31 ENCOUNTER — Encounter (HOSPITAL_COMMUNITY): Payer: Self-pay

## 2022-08-31 ENCOUNTER — Encounter (HOSPITAL_COMMUNITY)
Admission: RE | Admit: 2022-08-31 | Discharge: 2022-08-31 | Disposition: A | Discharge status: Home / Self Care | Payer: Commercial Managed Care - PPO | Source: Ambulatory Visit | Attending: Obstetrics & Gynecology | Admitting: Obstetrics & Gynecology

## 2022-08-31 VITALS — BP 149/78 | HR 80 | Temp 98.4°F | Resp 18 | Ht 66.0 in | Wt 316.8 lb

## 2022-08-31 DIAGNOSIS — Z01812 Encounter for preprocedural laboratory examination: Secondary | ICD-10-CM | POA: Diagnosis present

## 2022-08-31 DIAGNOSIS — Z01818 Encounter for other preprocedural examination: Secondary | ICD-10-CM

## 2022-08-31 LAB — BASIC METABOLIC PANEL
Anion gap: 9 (ref 5–15)
BUN: 10 mg/dL (ref 6–20)
CO2: 23 mmol/L (ref 22–32)
Calcium: 8.9 mg/dL (ref 8.9–10.3)
Chloride: 102 mmol/L (ref 98–111)
Creatinine, Ser: 0.76 mg/dL (ref 0.44–1.00)
GFR, Estimated: >60 mL/min (ref >60)
Glucose, Bld: 92 mg/dL (ref 70–99)
Potassium: 3.9 mmol/L (ref 3.5–5.1)
Sodium: 134 mmol/L — ABNORMAL LOW (ref 135–145)

## 2022-08-31 LAB — CBC
HCT: 41.9 % (ref 36.0–46.0)
Hemoglobin: 13 g/dL (ref 12.0–15.0)
MCH: 26.8 pg (ref 26.0–34.0)
MCHC: 31 g/dL (ref 30.0–36.0)
MCV: 86.4 fL (ref 80.0–100.0)
Platelets: 332 10*3/uL (ref 150–400)
RBC: 4.85 MIL/uL (ref 3.87–5.11)
RDW: 17.6 % — ABNORMAL HIGH (ref 11.5–15.5)
WBC: 5 10*3/uL (ref 4.0–10.5)
nRBC: 0 % (ref 0.0–0.2)

## 2022-08-31 LAB — TYPE AND SCREEN
ABO/RH(D): B POS
Antibody Screen: NEGATIVE

## 2022-08-31 NOTE — Progress Notes (Signed)
PCP - Dr. Leilani Able Cardiologist - Denies  PPM/ICD - Denies  Chest x-ray - N/A EKG - N/A Stress Test - Denies ECHO - Denies Cardiac Cath - Denies  Sleep Study - Denies  Diabetes: Denies  Blood Thinner Instructions: N/A Aspirin Instructions: N/A  ERAS Protcol - Yes PRE-SURGERY Ensure or G2- No  COVID TEST- N/A   Anesthesia review: No  Patient denies shortness of breath, fever, cough and chest pain at PAT appointment   All instructions explained to the patient, with a verbal understanding of the material. Patient agrees to go over the instructions while at home for a better understanding. Patient also instructed to self quarantine after being tested for COVID-19. The opportunity to ask questions was provided.

## 2022-09-05 MED ORDER — CLINDAMYCIN PHOSPHATE 900 MG/50ML IV SOLN
900.0000 mg | INTRAVENOUS | Status: AC
  Administered 2022-09-06: 900 mg via INTRAVENOUS
  Filled 2022-09-05: qty 50

## 2022-09-05 MED ORDER — GENTAMICIN SULFATE 40 MG/ML IJ SOLN
5.0000 mg/kg | INTRAVENOUS | Status: AC
  Administered 2022-09-06: 470 mg via INTRAVENOUS
  Filled 2022-09-05 (×2): qty 11.75

## 2022-09-05 NOTE — H&P (Incomplete)
Stacy Franklin is an 37 y.o. female presents for myomectomy   37 yo G1P0010 (one TAB hx), with very enlarged fibroid uterus, Uterus seemed larger on clinical exam.pt was seen 2 yrs back and had pelvic sono, she did not follw up for various reasons. Now having heavier bleeding and pain and desires intervention. She desires to keep child bearing option.  Smoker. Obese (BMI 49)  Nl pap 06/01/22 HPV neg.  Anemia w. H/H 11/36, A1C 6.0,, CMP, FLP, TSH okay.   pelvic sono- TV and TAS performed for complete pelvic sono. Enlarged uterus with multiple fibroids, 6 large myomas measured, overall uterus 22.6x12.3x 9.1 cm. Endometrial echo ill defined, 11.6 mm. ovaries not seen well. most myomas are on the right, large IM and SS noted and one left posterior IM noted. Fibroids ranges 4.1 to 10 cm   Pertinent Gynecological History: Menses: flow is excessive with use of several pads or tampons on heaviest days Contraception: none DES exposure: denies Blood transfusions: none Sexually transmitted diseases: no past history Previous GYN Procedures:  TAB   Last pap: normal  Patient's last menstrual period was 08/24/2022. MedHxx- obesity. Anemia, hirsutism, fibroids, smoker   Social History:  reports that she has been smoking cigarettes. She has been smoking an average of .5 packs per day. She has never used smokeless tobacco. She reports current alcohol use. She reports current drug use. Drug: Marijuana.  Allergies:  Allergies  Allergen Reactions   Oxycodone Nausea And Vomiting   Penicillin G Hives and Nausea Only    No medications prior to admission.   Review of Systems heavy menses, pain. Abdo pressure   Last menstrual period 08/24/2022. Physical Exam Physical exam:  A&O x 3, no acute distress. Pleasant HEENT neg, no thyromegaly Lungs CTA bilat CV RRR, S1S2 normal Abdo soft, non tender, non acute- large fibroid mass  Extr no edema/ tenderness Pelvic 20 wk uterus, multiple myomas, cx normal       Latest Ref Rng & Units 08/31/2022    2:00 PM 10/01/2020    2:20 PM  CBC  WBC 4.0 - 10.5 K/uL 5.0  7.2   Hemoglobin 12.0 - 15.0 g/dL 16.1  09.6   Hematocrit 36.0 - 46.0 % 41.9  40.6   Platelets 150 - 400 K/uL 332  371      Assessment/Plan: 37 yo female with large fibroid uterus, desiring myomectomy and uterine preservation for future child bearing.  Risks/complications of surgery reviewed incl infection, bleeding, damage to internal organs including bladder, bowels, ureters, blood vessels, other risks from anesthesia, VTE and delayed complications of any surgery, complications in future surgery reviewed.  Risk of leaving some myomas in crucial location for fertility sparing dw pt and risk of new myomas in future needing more surgeries dw pt.  Accepts blood transfusion but prefers her own so Cell saver use requested TAP block requested    Robley Fries 09/05/2022, 4:18 PM

## 2022-09-06 ENCOUNTER — Encounter (HOSPITAL_COMMUNITY): Payer: Self-pay | Admitting: Obstetrics & Gynecology

## 2022-09-06 ENCOUNTER — Inpatient Hospital Stay (HOSPITAL_COMMUNITY): Payer: Commercial Managed Care - PPO

## 2022-09-06 ENCOUNTER — Other Ambulatory Visit: Payer: Self-pay

## 2022-09-06 ENCOUNTER — Inpatient Hospital Stay (HOSPITAL_COMMUNITY)
Admission: RE | Admit: 2022-09-06 | Discharge: 2022-09-08 | DRG: 742 | Disposition: A | Discharge status: Home / Self Care | Payer: Commercial Managed Care - PPO | Attending: Obstetrics & Gynecology | Admitting: Obstetrics & Gynecology

## 2022-09-06 ENCOUNTER — Encounter (HOSPITAL_COMMUNITY)
Admission: RE | Disposition: A | Discharge status: Home / Self Care | Payer: Self-pay | Source: Home / Self Care | Attending: Obstetrics & Gynecology

## 2022-09-06 DIAGNOSIS — D259 Leiomyoma of uterus, unspecified: Secondary | ICD-10-CM | POA: Diagnosis present

## 2022-09-06 DIAGNOSIS — F17210 Nicotine dependence, cigarettes, uncomplicated: Secondary | ICD-10-CM | POA: Diagnosis present

## 2022-09-06 DIAGNOSIS — Z880 Allergy status to penicillin: Secondary | ICD-10-CM | POA: Diagnosis not present

## 2022-09-06 DIAGNOSIS — Z9889 Other specified postprocedural states: Secondary | ICD-10-CM

## 2022-09-06 DIAGNOSIS — Z6841 Body Mass Index (BMI) 40.0 and over, adult: Secondary | ICD-10-CM | POA: Diagnosis not present

## 2022-09-06 DIAGNOSIS — D219 Benign neoplasm of connective and other soft tissue, unspecified: Principal | ICD-10-CM

## 2022-09-06 DIAGNOSIS — N920 Excessive and frequent menstruation with regular cycle: Secondary | ICD-10-CM | POA: Diagnosis present

## 2022-09-06 DIAGNOSIS — Z01818 Encounter for other preprocedural examination: Secondary | ICD-10-CM

## 2022-09-06 HISTORY — PX: MYOMECTOMY: SHX85

## 2022-09-06 LAB — POCT PREGNANCY, URINE: Preg Test, Ur: NEGATIVE

## 2022-09-06 LAB — ABO/RH: ABO/RH(D): B POS

## 2022-09-06 SURGERY — MYOMECTOMY, ABDOMINAL APPROACH
Anesthesia: General

## 2022-09-06 MED ORDER — SODIUM CHLORIDE 0.9 % IV SOLN: INTRAVENOUS | Status: DC | PRN

## 2022-09-06 MED ORDER — FENTANYL CITRATE (PF) 100 MCG/2ML IJ SOLN
INTRAMUSCULAR | Status: AC
  Administered 2022-09-06: 50 ug via INTRAVENOUS
  Filled 2022-09-06: qty 2

## 2022-09-06 MED ORDER — DOCUSATE SODIUM 100 MG PO CAPS
100.0000 mg | ORAL_CAPSULE | Freq: Two times a day (BID) | ORAL | Status: DC
  Administered 2022-09-07 – 2022-09-08 (×3): 100 mg via ORAL
  Filled 2022-09-06 (×3): qty 1

## 2022-09-06 MED ORDER — LACTATED RINGERS IV SOLN: INTRAVENOUS | Status: DC

## 2022-09-06 MED ORDER — ONDANSETRON HCL 4 MG/2ML IJ SOLN
INTRAMUSCULAR | Status: DC | PRN
  Administered 2022-09-06: 4 mg via INTRAVENOUS

## 2022-09-06 MED ORDER — ONDANSETRON HCL 4 MG/2ML IJ SOLN
4.0000 mg | Freq: Four times a day (QID) | INTRAMUSCULAR | Status: DC | PRN
  Administered 2022-09-07: 4 mg via INTRAVENOUS
  Filled 2022-09-06: qty 2

## 2022-09-06 MED ORDER — FENTANYL CITRATE (PF) 250 MCG/5ML IJ SOLN
INTRAMUSCULAR | Status: AC
  Filled 2022-09-06: qty 5

## 2022-09-06 MED ORDER — VASOPRESSIN 20 UNIT/ML IV SOLN
INTRAVENOUS | Status: AC
  Filled 2022-09-06: qty 1

## 2022-09-06 MED ORDER — HYDROMORPHONE HCL 1 MG/ML IJ SOLN
2.0000 mg | INTRAMUSCULAR | Status: DC | PRN
  Administered 2022-09-07 (×2): 2 mg via INTRAVENOUS
  Filled 2022-09-06 (×3): qty 2

## 2022-09-06 MED ORDER — MIDAZOLAM HCL 2 MG/2ML IJ SOLN
INTRAMUSCULAR | Status: AC
  Administered 2022-09-06: 1 mg via INTRAVENOUS
  Filled 2022-09-06: qty 2

## 2022-09-06 MED ORDER — CHLORHEXIDINE GLUCONATE 0.12 % MT SOLN: 15.0000 mL | Freq: Once | OROMUCOSAL | Status: AC

## 2022-09-06 MED ORDER — FENTANYL CITRATE (PF) 100 MCG/2ML IJ SOLN
INTRAMUSCULAR | Status: AC
  Filled 2022-09-06: qty 2

## 2022-09-06 MED ORDER — FENTANYL CITRATE (PF) 250 MCG/5ML IJ SOLN
INTRAMUSCULAR | Status: DC | PRN
  Administered 2022-09-06: 100 ug via INTRAVENOUS
  Administered 2022-09-06 (×2): 50 ug via INTRAVENOUS
  Administered 2022-09-06: 100 ug via INTRAVENOUS
  Administered 2022-09-06: 50 ug via INTRAVENOUS

## 2022-09-06 MED ORDER — SIMETHICONE 80 MG PO CHEW
80.0000 mg | CHEWABLE_TABLET | Freq: Four times a day (QID) | ORAL | Status: DC | PRN
  Administered 2022-09-07 – 2022-09-08 (×3): 80 mg via ORAL
  Filled 2022-09-06 (×3): qty 1

## 2022-09-06 MED ORDER — BUPIVACAINE HCL (PF) 0.25 % IJ SOLN
INTRAMUSCULAR | Status: AC
  Filled 2022-09-06: qty 30

## 2022-09-06 MED ORDER — ROCURONIUM BROMIDE 10 MG/ML (PF) SYRINGE
PREFILLED_SYRINGE | INTRAVENOUS | Status: DC | PRN
  Administered 2022-09-06 (×2): 20 mg via INTRAVENOUS
  Administered 2022-09-06: 30 mg via INTRAVENOUS
  Administered 2022-09-06: 60 mg via INTRAVENOUS

## 2022-09-06 MED ORDER — ROCURONIUM BROMIDE 10 MG/ML (PF) SYRINGE
PREFILLED_SYRINGE | INTRAVENOUS | Status: AC
  Filled 2022-09-06: qty 10

## 2022-09-06 MED ORDER — GLYCOPYRROLATE PF 0.2 MG/ML IJ SOSY
PREFILLED_SYRINGE | INTRAMUSCULAR | Status: AC
  Filled 2022-09-06: qty 1

## 2022-09-06 MED ORDER — DEXAMETHASONE SODIUM PHOSPHATE 10 MG/ML IJ SOLN
INTRAMUSCULAR | Status: AC
  Filled 2022-09-06: qty 1

## 2022-09-06 MED ORDER — HYDROMORPHONE HCL 1 MG/ML IJ SOLN
0.5000 mg | INTRAMUSCULAR | Status: DC | PRN
  Administered 2022-09-06: 0.5 mg via INTRAVENOUS
  Filled 2022-09-06: qty 0.5

## 2022-09-06 MED ORDER — MIDAZOLAM HCL 2 MG/2ML IJ SOLN
INTRAMUSCULAR | Status: DC | PRN
  Administered 2022-09-06 (×2): 1 mg via INTRAVENOUS
  Administered 2022-09-06: 2 mg via INTRAVENOUS

## 2022-09-06 MED ORDER — POVIDONE-IODINE 10 % EX SWAB
2.0000 | Freq: Once | CUTANEOUS | Status: AC
  Administered 2022-09-06: 2 via TOPICAL

## 2022-09-06 MED ORDER — PHENYLEPHRINE 80 MCG/ML (10ML) SYRINGE FOR IV PUSH (FOR BLOOD PRESSURE SUPPORT)
PREFILLED_SYRINGE | INTRAVENOUS | Status: AC
  Filled 2022-09-06: qty 10

## 2022-09-06 MED ORDER — ONDANSETRON HCL 4 MG PO TABS: 4.0000 mg | ORAL_TABLET | Freq: Four times a day (QID) | ORAL | Status: DC | PRN

## 2022-09-06 MED ORDER — MIDAZOLAM HCL 2 MG/2ML IJ SOLN
INTRAMUSCULAR | Status: AC
  Filled 2022-09-06: qty 2

## 2022-09-06 MED ORDER — MIDAZOLAM HCL 2 MG/2ML IJ SOLN: 1.0000 mg | Freq: Once | INTRAMUSCULAR | Status: AC

## 2022-09-06 MED ORDER — EPHEDRINE 5 MG/ML INJ
INTRAVENOUS | Status: AC
  Filled 2022-09-06: qty 5

## 2022-09-06 MED ORDER — KETOROLAC TROMETHAMINE 30 MG/ML IJ SOLN
INTRAMUSCULAR | Status: AC
  Filled 2022-09-06: qty 1

## 2022-09-06 MED ORDER — ONDANSETRON HCL 4 MG/2ML IJ SOLN: 4.0000 mg | Freq: Four times a day (QID) | INTRAMUSCULAR | Status: DC | PRN

## 2022-09-06 MED ORDER — DEXAMETHASONE SODIUM PHOSPHATE 10 MG/ML IJ SOLN
INTRAMUSCULAR | Status: DC | PRN
  Administered 2022-09-06: 10 mg via INTRAVENOUS

## 2022-09-06 MED ORDER — SUGAMMADEX SODIUM 200 MG/2ML IV SOLN
INTRAVENOUS | Status: DC | PRN
  Administered 2022-09-06: 200 mg via INTRAVENOUS

## 2022-09-06 MED ORDER — LIDOCAINE 2% (20 MG/ML) 5 ML SYRINGE
INTRAMUSCULAR | Status: AC
  Filled 2022-09-06: qty 5

## 2022-09-06 MED ORDER — CHLORHEXIDINE GLUCONATE 0.12 % MT SOLN
OROMUCOSAL | Status: AC
  Administered 2022-09-06: 15 mL via OROMUCOSAL
  Filled 2022-09-06: qty 15

## 2022-09-06 MED ORDER — ORAL CARE MOUTH RINSE: 15.0000 mL | Freq: Once | OROMUCOSAL | Status: AC

## 2022-09-06 MED ORDER — FENTANYL CITRATE (PF) 100 MCG/2ML IJ SOLN
25.0000 ug | INTRAMUSCULAR | Status: DC | PRN
  Administered 2022-09-06: 50 ug via INTRAVENOUS

## 2022-09-06 MED ORDER — ONDANSETRON HCL 4 MG/2ML IJ SOLN
INTRAMUSCULAR | Status: AC
  Filled 2022-09-06: qty 2

## 2022-09-06 MED ORDER — HYDROCODONE-ACETAMINOPHEN 5-325 MG PO TABS
1.0000 | ORAL_TABLET | ORAL | Status: DC | PRN
  Administered 2022-09-07 (×5): 1 via ORAL
  Administered 2022-09-08 (×3): 2 via ORAL
  Filled 2022-09-06: qty 2
  Filled 2022-09-06 (×2): qty 1
  Filled 2022-09-06: qty 2
  Filled 2022-09-06 (×2): qty 1
  Filled 2022-09-06: qty 2
  Filled 2022-09-06: qty 1

## 2022-09-06 MED ORDER — VASOPRESSIN 20 UNIT/ML IV SOLN
INTRAVENOUS | Status: DC | PRN
  Administered 2022-09-06: 37 mL via INTRAMUSCULAR

## 2022-09-06 MED ORDER — IBUPROFEN 600 MG PO TABS
600.0000 mg | ORAL_TABLET | Freq: Four times a day (QID) | ORAL | Status: DC
  Administered 2022-09-07 – 2022-09-08 (×4): 600 mg via ORAL
  Filled 2022-09-06 (×4): qty 1

## 2022-09-06 MED ORDER — HYDROMORPHONE HCL 1 MG/ML IJ SOLN
4.0000 mg | INTRAMUSCULAR | Status: DC | PRN
  Administered 2022-09-06: 4 mg via INTRAVENOUS
  Filled 2022-09-06 (×2): qty 4

## 2022-09-06 MED ORDER — PROPOFOL 10 MG/ML IV BOLUS
INTRAVENOUS | Status: DC | PRN
  Administered 2022-09-06: 200 mg via INTRAVENOUS
  Administered 2022-09-06: 50 mg via INTRAVENOUS

## 2022-09-06 MED ORDER — ACETAMINOPHEN 500 MG PO TABS
1000.0000 mg | ORAL_TABLET | Freq: Four times a day (QID) | ORAL | Status: DC
  Administered 2022-09-07: 1000 mg via ORAL
  Filled 2022-09-06: qty 2

## 2022-09-06 MED ORDER — ALBUMIN HUMAN 5 % IV SOLN: INTRAVENOUS | Status: DC | PRN

## 2022-09-06 MED ORDER — SUCCINYLCHOLINE CHLORIDE 200 MG/10ML IV SOSY
PREFILLED_SYRINGE | INTRAVENOUS | Status: AC
  Filled 2022-09-06: qty 10

## 2022-09-06 MED ORDER — MENTHOL 3 MG MT LOZG: 1.0000 | LOZENGE | OROMUCOSAL | Status: DC | PRN

## 2022-09-06 MED ORDER — DEXMEDETOMIDINE HCL IN NACL 80 MCG/20ML IV SOLN
INTRAVENOUS | Status: DC | PRN
  Administered 2022-09-06 (×2): 8 ug via INTRAVENOUS

## 2022-09-06 MED ORDER — ROPIVACAINE HCL 5 MG/ML IJ SOLN
INTRAMUSCULAR | Status: DC | PRN
  Administered 2022-09-06 (×2): 20 mL via PERINEURAL

## 2022-09-06 MED ORDER — ALBUTEROL SULFATE HFA 108 (90 BASE) MCG/ACT IN AERS
INHALATION_SPRAY | RESPIRATORY_TRACT | Status: DC | PRN
  Administered 2022-09-06: 6 via RESPIRATORY_TRACT

## 2022-09-06 MED ORDER — PROPOFOL 10 MG/ML IV BOLUS
INTRAVENOUS | Status: AC
  Filled 2022-09-06: qty 20

## 2022-09-06 MED ORDER — FENTANYL CITRATE (PF) 100 MCG/2ML IJ SOLN: 50.0000 ug | Freq: Once | INTRAMUSCULAR | Status: AC

## 2022-09-06 SURGICAL SUPPLY — 37 items
APL SKNCLS STERI-STRIP NONHPOA (GAUZE/BANDAGES/DRESSINGS) ×1
BARRIER ADHS 3X4 INTERCEED (GAUZE/BANDAGES/DRESSINGS) IMPLANT
BENZOIN TINCTURE PRP APPL 2/3 (GAUZE/BANDAGES/DRESSINGS) ×1 IMPLANT
BRR ADH 4X3 ABS CNTRL BYND (GAUZE/BANDAGES/DRESSINGS) ×2
CONT SPEC PATH 64OZ SNAP LID (MISCELLANEOUS) ×1 IMPLANT
DRAPE CESAREAN BIRTH W POUCH (DRAPES) ×1 IMPLANT
DRSG OPSITE POSTOP 4X10 (GAUZE/BANDAGES/DRESSINGS) ×1 IMPLANT
DURAPREP 26ML APPLICATOR (WOUND CARE) ×1 IMPLANT
ELECT NDL TIP 2.8 STRL (NEEDLE) ×1 IMPLANT
ELECT NEEDLE TIP 2.8 STRL (NEEDLE) ×1 IMPLANT
GAUZE 4X4 16PLY ~~LOC~~+RFID DBL (SPONGE) IMPLANT
GLOVE BIOGEL PI IND STRL 7.0 (GLOVE) ×2 IMPLANT
GLOVE SURG ENC MOIS LTX SZ6.5 (GLOVE) ×1 IMPLANT
GOWN STRL REUS W/ TWL LRG LVL3 (GOWN DISPOSABLE) ×3 IMPLANT
GOWN STRL REUS W/TWL LRG LVL3 (GOWN DISPOSABLE) ×4
KIT TURNOVER KIT B (KITS) ×1 IMPLANT
NEEDLE HYPO 22GX1.5 SAFETY (NEEDLE) ×2 IMPLANT
NS IRRIG 1000ML POUR BTL (IV SOLUTION) ×1 IMPLANT
PACK ABDOMINAL GYN (CUSTOM PROCEDURE TRAY) ×1 IMPLANT
PAD ARMBOARD 7.5X6 YLW CONV (MISCELLANEOUS) ×1 IMPLANT
PAD OB MATERNITY 4.3X12.25 (PERSONAL CARE ITEMS) ×1 IMPLANT
SPONGE T-LAP 18X18 ~~LOC~~+RFID (SPONGE) IMPLANT
STRIP CLOSURE SKIN 1/2X4 (GAUZE/BANDAGES/DRESSINGS) IMPLANT
SUT MON AB 2-0 CT1 27 (SUTURE) ×1 IMPLANT
SUT MON AB 2-0 SH 27 (SUTURE) ×1
SUT MON AB 2-0 SH27 (SUTURE) ×1 IMPLANT
SUT MON AB 3-0 SH 27 (SUTURE) ×1
SUT MON AB 3-0 SH27 (SUTURE) ×1 IMPLANT
SUT PLAIN 2 0 XLH (SUTURE) IMPLANT
SUT VIC AB 0 CT1 18XCR BRD8 (SUTURE) ×1 IMPLANT
SUT VIC AB 0 CT1 27 (SUTURE) ×3
SUT VIC AB 0 CT1 27XBRD ANBCTR (SUTURE) ×2 IMPLANT
SUT VIC AB 0 CT1 8-18 (SUTURE) ×4
SUT VIC AB 4-0 KS 27 (SUTURE) ×1 IMPLANT
SYR CONTROL 10ML LL (SYRINGE) ×2 IMPLANT
TOWEL GREEN STERILE FF (TOWEL DISPOSABLE) ×2 IMPLANT
TRAY FOLEY W/BAG SLVR 14FR (SET/KITS/TRAYS/PACK) ×1 IMPLANT

## 2022-09-06 NOTE — Anesthesia Procedure Notes (Signed)
Anesthesia Regional Block: TAP block   Pre-Anesthetic Checklist: , timeout performed,  Correct Patient, Correct Site, Correct Laterality,  Correct Procedure, Correct Position, site marked,  Risks and benefits discussed,  Surgical consent,  Pre-op evaluation,  At surgeon's request and post-op pain management  Laterality: Left  Prep: chloraprep       Needles:  Injection technique: Single-shot  Needle Type: Echogenic Needle     Needle Length: 9cm  Needle Gauge: 21     Additional Needles:   Narrative:  Start time: 09/06/2022 12:25 PM End time: 09/06/2022 12:34 PM Injection made incrementally with aspirations every 5 mL.  Performed by: Personally  Anesthesiologist: Achille Rich, MD  Additional Notes: Pt tolerated the procedure well.

## 2022-09-06 NOTE — Anesthesia Postprocedure Evaluation (Signed)
Anesthesia Post Note  Patient: Stacy Franklin  Procedure(s) Performed: ABDOMINAL MYOMECTOMY APPLICATION OF CELL SAVER     Patient location during evaluation: PACU Anesthesia Type: General Level of consciousness: sedated, patient cooperative and oriented Pain management: pain level controlled Vital Signs Assessment: post-procedure vital signs reviewed and stable Respiratory status: spontaneous breathing, nonlabored ventilation and respiratory function stable Cardiovascular status: blood pressure returned to baseline and stable Postop Assessment: no apparent nausea or vomiting Anesthetic complications: no   No notable events documented.  Last Vitals:  Vitals:   09/06/22 1730 09/06/22 1747  BP: 134/75 133/68  Pulse: 86 85  Resp: (!) 22 (!) 21  Temp: 36.7 C 36.5 C  SpO2: 93% 100%    Last Pain:  Vitals:   09/06/22 1747  TempSrc: Oral  PainSc: 8                  Jermayne Sweeney,E. Lucila Klecka

## 2022-09-06 NOTE — Anesthesia Preprocedure Evaluation (Signed)
Anesthesia Evaluation  Patient identified by MRN, date of birth, ID band Patient awake    Reviewed: Allergy & Precautions, H&P , NPO status , Patient's Chart, lab work & pertinent test results  Airway Mallampati: II   Neck ROM: full    Dental   Pulmonary Current Smoker   breath sounds clear to auscultation       Cardiovascular negative cardio ROS  Rhythm:regular Rate:Normal     Neuro/Psych    GI/Hepatic   Endo/Other    Morbid obesity  Renal/GU      Musculoskeletal   Abdominal   Peds  Hematology   Anesthesia Other Findings   Reproductive/Obstetrics                             Anesthesia Physical Anesthesia Plan  ASA: 2  Anesthesia Plan: General   Post-op Pain Management: Regional block*   Induction: Intravenous  PONV Risk Score and Plan: 2 and Ondansetron, Dexamethasone, Midazolam and Treatment may vary due to age or medical condition  Airway Management Planned: Oral ETT  Additional Equipment:   Intra-op Plan:   Post-operative Plan: Extubation in OR  Informed Consent: I have reviewed the patients History and Physical, chart, labs and discussed the procedure including the risks, benefits and alternatives for the proposed anesthesia with the patient or authorized representative who has indicated his/her understanding and acceptance.     Dental advisory given  Plan Discussed with: CRNA, Anesthesiologist and Surgeon  Anesthesia Plan Comments:        Anesthesia Quick Evaluation  

## 2022-09-06 NOTE — Anesthesia Procedure Notes (Signed)
Procedure Name: Intubation Date/Time: 09/06/2022 1:15 PM  Performed by: Kayleen Memos, CRNAPre-anesthesia Checklist: Patient identified, Suction available, Emergency Drugs available, Patient being monitored and Timeout performed Patient Re-evaluated:Patient Re-evaluated prior to induction Oxygen Delivery Method: Circle system utilized Preoxygenation: Pre-oxygenation with 100% oxygen Induction Type: IV induction Ventilation: Oral airway inserted - appropriate to patient size and Two handed mask ventilation required Laryngoscope Size: Mac and 4 Grade View: Grade I Tube type: Oral Tube size: 7.5 mm Number of attempts: 1 Airway Equipment and Method: Stylet Placement Confirmation: ETT inserted through vocal cords under direct vision, positive ETCO2, CO2 detector and breath sounds checked- equal and bilateral Secured at: 23 cm Tube secured with: Tape Dental Injury: Teeth and Oropharynx as per pre-operative assessment

## 2022-09-06 NOTE — Anesthesia Procedure Notes (Signed)
Anesthesia Regional Block: TAP block   Pre-Anesthetic Checklist: , timeout performed,  Correct Patient, Correct Site, Correct Laterality,  Correct Procedure, Correct Position, site marked,  Risks and benefits discussed,  Surgical consent,  Pre-op evaluation,  At surgeon's request and post-op pain management  Laterality: Right  Prep: chloraprep       Needles:  Injection technique: Single-shot  Needle Type: Echogenic Needle     Needle Length: 9cm  Needle Gauge: 21     Additional Needles:   Narrative:  Start time: 09/06/2022 12:34 PM End time: 09/06/2022 12:43 PM Injection made incrementally with aspirations every 5 mL.  Performed by: Personally  Anesthesiologist: Achille Rich, MD  Additional Notes: Pt tolerated the procedure well.

## 2022-09-06 NOTE — Op Note (Signed)
09/06/2022 Stacy Franklin (1985-08-10)   PRE-OPERATIVE DIAGNOSIS:  Uterine Fibroids    POST-OPERATIVE DIAGNOSIS:  Uterine Fibroids  PROCEDURE:  ABDOMINAL MYOMECTOMY  SURGEON: Robley Fries, MD  Co- Surgeon: Clance Boll, DO   ANESTHESIA:  General endotracheal and TAP block before surgery   EBL: 2000 cc   IVF: LR 2500 cc         Albumin 500 cc   BLOOD ADMINISTERED: Harvested from Cell Saver:  750 cc   Urine output: urine in foley: 200 cc   LOCAL MEDICATIONS USED:  none since she had TAP block      SPECIMEN:  10 uterine fibroids   COUNTS: correct  PATIENT DISPOSITION:  PACU - hemodynamically stable.    Delay start of Pharmacological VTE agent (>24hrs) due to surgical blood loss or risk of bleeding: yes  PROCEDURE:   Indication:  Symptomatic uterine fibroids with abdominal mass, pain and menorrhagia. She is here for abdominal myomectomy due to size and number of myomas. Risks and complications of surgery including infection, bleeding, damage to internal organs and other including but not limited to surgery related problems including pneumonia, VTE reviewed. Informed written consent was obtained.   Patient was brought to the operating room with IV running. She had received TAP block in pre-op holding area. She received Gentamicin and Clindamycin per protocol. She underwent general endotracheal anesthesia without difficulty and was given dorsal supine position, prepped and draped in sterile fashion. Foley catheter was placed. Exam under anesthesia noted uterus at the umbilicus but mobile. Pfannenstiel incision was made with scalpel and carried down to the underlying fascia with Bovie with excellent hemostasis.  Fascia incised and extended laterally. Fascia grasped with Kocher's and underlying rectus muscles were dissected down. Rectus muscles were separated in midline. Posterior rectus sheath and posterior peritoneum was grasped with mosquitoes and peritoneal entry made. Large fibroid  uterus was palpated. No adhesions noted. Difficult to palpate upper abdomen due to body habitus. Uterus could not be delivered out of the abdominal incision due to myomas. Left lateral fundal myoma was injected with 5 cc of 20:60 cc dilute pitressin in saline and towel clamp placed to deliver uterus out of the pelvis and as much as possible out of the incision. We did not use abdominal wall retractor due to lack of space. Anatomy was distorted. Left fallopian tube and left ovary identified and were normal. Right tube and ovary seen only after several myomas were removed and appeared normal. Multiple myomas noted. Uterus injected with dilute pitressin and using point cautery serosal incision made on left fundal myoma (10 cm). By constant retraction and dissection myoma was enucleated and handed off and bleeding from bed cauterized. Serosal bleeding cauterized. Excessive serosa was not excised.  We used this incision to remove 3 more myomas from left side staying posterior to tubal origin from uterus and preventing distortion. Now two large right anterior lower myomas were removed from two separate incisions in similar fashion. Few other myomas were removed from these anterior myoma beds by tunneling. There was another right lower lateral myoma that was not removed as it was right at the level of uterine isthmus and uterine vessels. Two posterior myomas were also not removed being close to uterine vessels and avoid risk of hysterectomy. Overall 10 myomas were removed from 3 incisions. Myoma beds were closed in 3 layers, first two to approximate dead space and last to close serosa. I mainly used 0-vicryl due to thick uterine wall and even serosa closed  with same due to being thick needing large suture.  Several figure of eight sutures were needed to control bleeding from closure line of large fundal myoma. Last was the pedunculated myoma. Kelly clamp placed across the stalk and myoma excised ( 8 cm) and base was suture  transfixed and another layer closure done. Bleeding was extensive throughout surgery and cell-saver was set up from the start to give back patient's harvested red cells as patient agreed. She was given 750 cc from cell-saver. Irrigation performed, all incisions reassessed and hemostasis was noted. Uterus placed back in pelvis and then Interceed x2 placed on incisions.  Peritoneal edges grasped and brought to midline but not sutured. Fascia sutured with 0 Vicryl from two ends and met in midline. Subcutaneous layer was deep and closed with 2-0 Plain gut. Skin approximated with 4-0 Vicryl in subcuticular fashion. Sterile dressings placed.  All counts were correct x2.   Dr Juliene Pina was the primary surgeon for entire case.    --Shea Evans MD  [facility]

## 2022-09-06 NOTE — Transfer of Care (Signed)
Immediate Anesthesia Transfer of Care Note  Patient: Stacy Franklin  Procedure(s) Performed: ABDOMINAL MYOMECTOMY APPLICATION OF CELL SAVER  Patient Location: PACU  Anesthesia Type:General  Level of Consciousness: oriented and drowsy  Airway & Oxygen Therapy: Patient Spontanous Breathing and Patient connected to face mask oxygen  Post-op Assessment: Report given to RN, Post -op Vital signs reviewed and stable, and Patient moving all extremities  Post vital signs: stable  Last Vitals:  Vitals Value Taken Time  BP 135/71 09/06/22 1700  Temp 36.7 C 09/06/22 1658  Pulse 87 09/06/22 1704  Resp 20 09/06/22 1704  SpO2 100 % 09/06/22 1704  Vitals shown include unvalidated device data.  Last Pain:  Vitals:   09/06/22 1240  TempSrc:   PainSc: 0-No pain         Complications: No notable events documented.

## 2022-09-07 ENCOUNTER — Encounter (HOSPITAL_COMMUNITY): Payer: Self-pay | Admitting: Obstetrics & Gynecology

## 2022-09-07 LAB — POCT I-STAT EG7
Acid-base deficit: 3 mmol/L — ABNORMAL HIGH (ref 0.0–2.0)
Bicarbonate: 22.1 mmol/L (ref 20.0–28.0)
Calcium, Ion: 1.14 mmol/L — ABNORMAL LOW (ref 1.15–1.40)
HCT: 35 % — ABNORMAL LOW (ref 36.0–46.0)
Hemoglobin: 11.9 g/dL — ABNORMAL LOW (ref 12.0–15.0)
O2 Saturation: 86 %
Patient temperature: 36.3
Potassium: 3.9 mmol/L (ref 3.5–5.1)
Sodium: 137 mmol/L (ref 135–145)
TCO2: 23 mmol/L (ref 22–32)
pCO2, Ven: 39.8 mmHg — ABNORMAL LOW (ref 44–60)
pH, Ven: 7.349 (ref 7.25–7.43)
pO2, Ven: 52 mmHg — ABNORMAL HIGH (ref 32–45)

## 2022-09-07 LAB — CBC
HCT: 34.2 % — ABNORMAL LOW (ref 36.0–46.0)
Hemoglobin: 10.5 g/dL — ABNORMAL LOW (ref 12.0–15.0)
MCH: 26.1 pg (ref 26.0–34.0)
MCHC: 30.7 g/dL (ref 30.0–36.0)
MCV: 84.9 fL (ref 80.0–100.0)
Platelets: 295 10*3/uL (ref 150–400)
RBC: 4.03 MIL/uL (ref 3.87–5.11)
RDW: 16.5 % — ABNORMAL HIGH (ref 11.5–15.5)
WBC: 12.6 10*3/uL — ABNORMAL HIGH (ref 4.0–10.5)
nRBC: 0 % (ref 0.0–0.2)

## 2022-09-07 LAB — BASIC METABOLIC PANEL
Anion gap: 8 (ref 5–15)
BUN: 6 mg/dL (ref 6–20)
CO2: 24 mmol/L (ref 22–32)
Calcium: 8.6 mg/dL — ABNORMAL LOW (ref 8.9–10.3)
Chloride: 104 mmol/L (ref 98–111)
Creatinine, Ser: 0.72 mg/dL (ref 0.44–1.00)
GFR, Estimated: >60 mL/min (ref >60)
Glucose, Bld: 131 mg/dL — ABNORMAL HIGH (ref 70–99)
Potassium: 4.5 mmol/L (ref 3.5–5.1)
Sodium: 136 mmol/L (ref 135–145)

## 2022-09-07 MED ORDER — SODIUM CHLORIDE 0.9 % IV SOLN
500.0000 mg | Freq: Once | INTRAVENOUS | Status: AC
  Administered 2022-09-07: 500 mg via INTRAVENOUS
  Filled 2022-09-07: qty 500

## 2022-09-07 MED ORDER — FAMOTIDINE IN NACL 20-0.9 MG/50ML-% IV SOLN
20.0000 mg | Freq: Once | INTRAVENOUS | Status: AC
  Administered 2022-09-07: 20 mg via INTRAVENOUS
  Filled 2022-09-07: qty 50

## 2022-09-07 MED ORDER — DIPHENHYDRAMINE HCL 25 MG PO CAPS
25.0000 mg | ORAL_CAPSULE | Freq: Once | ORAL | Status: AC
  Administered 2022-09-07: 25 mg via ORAL
  Filled 2022-09-07: qty 1

## 2022-09-07 MED ORDER — FAMOTIDINE 20 MG PO TABS
20.0000 mg | ORAL_TABLET | Freq: Two times a day (BID) | ORAL | Status: DC
  Administered 2022-09-08: 20 mg via ORAL
  Filled 2022-09-07: qty 1

## 2022-09-07 NOTE — Progress Notes (Signed)
1 Day Post-Op Procedure(s) (LRB): ABDOMINAL MYOMECTOMY (N/A) APPLICATION OF CELL SAVER (N/A)  Subjective: Patient reports {sub:3041132}.    Objective: I have reviewed patient's {reviewed:3041135}.  {Physical WUJW:1191478}  Assessment: s/p Procedure(s): ABDOMINAL MYOMECTOMY (N/A) APPLICATION OF CELL SAVER (N/A): {assessment details:3041134}  Plan: {GNFA:2130865}  LOS: 1 day    Robley Fries, MD 09/07/2022, 8:50 AM

## 2022-09-08 MED ORDER — HYDROCODONE-ACETAMINOPHEN 5-325 MG PO TABS: 1.0000 | ORAL_TABLET | Freq: Four times a day (QID) | ORAL | 0 refills | Status: AC | PRN

## 2022-09-08 MED ORDER — IBUPROFEN 600 MG PO TABS: 600.0000 mg | ORAL_TABLET | Freq: Four times a day (QID) | ORAL | 0 refills | Status: AC

## 2022-09-08 MED ORDER — NICOTINE 14 MG/24HR TD PT24: 14.0000 mg | MEDICATED_PATCH | Freq: Every day | TRANSDERMAL | 0 refills | Status: AC

## 2022-09-08 MED ORDER — SIMETHICONE 80 MG PO CHEW: 80.0000 mg | CHEWABLE_TABLET | Freq: Four times a day (QID) | ORAL | 0 refills | Status: AC | PRN

## 2022-09-08 MED ORDER — ACETAMINOPHEN 500 MG PO TABS: 1000.0000 mg | ORAL_TABLET | Freq: Four times a day (QID) | ORAL | 0 refills | Status: AC

## 2022-09-08 NOTE — Progress Notes (Signed)
2 Days Post-Op Procedure(s) (LRB): ABDOMINAL MYOMECTOMY (N/A) TAB block APPLICATION OF CELL SAVER (N/A)  Subjective: Ate a lot today. Ambulating and flatus + Pain well controlled   Objective: I have reviewed patient's vital signs, intake and output, medications, and labs.  General: alert and cooperative Resp: clear to auscultation bilaterally Cardio: regular rate and rhythm, S1, S2 normal, no murmur, click, rub or gallop GI: soft, non-tender; bowel sounds normal; no masses,  no organomegaly Extremities: Homans sign is negative, no sign of DVT Vaginal Bleeding: none  Assessment: s/p Procedure(s): ABDOMINAL MYOMECTOMY (N/A) APPLICATION OF CELL SAVER (N/A): stable and doing well for POD 2  Plan: Discharge home, post op care and call parameters and after-hr info dw pt   Robley Fries, MD 09/08/2022, 10:26 AM

## 2022-09-08 NOTE — Discharge Summary (Signed)
Physician Discharge Summary  Patient ID: Stacy Franklin MRN: [id number] DOB/AGE: [birth year] 35 y.o.  Admit date: 09/06/2022 Discharge date: 09/08/2022  Admission Diagnoses:  Discharge Diagnoses:  Principal Problem:   Fibroids Active Problems:   S/P myomectomy   Discharged Condition: {condition:18240}  Hospital Course: ***  Consults: {consultation:18241}  Significant Diagnostic Studies: {diagnostics:18242}  Treatments: {Tx:18249}  Discharge Exam: Blood pressure (!) 104/57, pulse 82, temperature 98.1 F (36.7 C), temperature source Oral, resp. rate 18, height 5\' 6"  (1.676 m), weight (!) 142.9 kg, last menstrual period 08/24/2022, SpO2 97 %. {physical QION:6295284}  Disposition: Discharge disposition: 01-Home or Self Care       Discharge Instructions     Call MD for:   Complete by: As directed    Very heavy vaginal bleeding   Call MD for:  difficulty breathing, headache or visual disturbances   Complete by: As directed    Call MD for:  extreme fatigue   Complete by: As directed    Call MD for:  hives   Complete by: As directed    Call MD for:  persistant dizziness or light-headedness   Complete by: As directed    Call MD for:  persistant nausea and vomiting   Complete by: As directed    Call MD for:  redness, tenderness, or signs of infection (pain, swelling, redness, odor or green/yellow discharge around incision site)   Complete by: As directed    Call MD for:  severe uncontrolled pain   Complete by: As directed    Call MD for:  temperature >100.4   Complete by: As directed    Diet - low sodium heart healthy   Complete by: As directed    Discharge wound care:   Complete by: As directed    Remove honeycomb dressing on 09/12/22   Driving Restrictions   Complete by: As directed    2 weeks   Increase activity slowly   Complete by: As directed    Lifting restrictions   Complete by: As directed    10 pounds for 6 weeks   Sexual Activity Restrictions   Complete  by: As directed    6 weeks      Allergies as of 09/08/2022       Reactions   Oxycodone Nausea And Vomiting   Penicillin G Hives, Nausea Only        Medication List     STOP taking these medications    ferrous sulfate 325 (65 FE) MG tablet       TAKE these medications    acetaminophen 500 MG tablet Commonly known as: TYLENOL Take 2 tablets (1,000 mg total) by mouth every 6 (six) hours.   Ashwagandha 500 MG Caps Take 500 mg by mouth daily as needed (calm/relaxation).   HYDROcodone-acetaminophen 5-325 MG tablet Commonly known as: NORCO/VICODIN Take 1 tablet by mouth every 6 (six) hours as needed for moderate pain.   ibuprofen 600 MG tablet Commonly known as: ADVIL Take 1 tablet (600 mg total) by mouth every 6 (six) hours.   Midol Complete 500-60-15 MG Tabs Generic drug: Acetaminophen-Caff-Pyrilamine Take 1 tablet by mouth 2 (two) times daily as needed (cramping).   multivitamin with minerals tablet Take 1 tablet by mouth daily.   simethicone 80 MG chewable tablet Commonly known as: MYLICON Chew 1 tablet (80 mg total) by mouth 4 (four) times daily as needed for flatulence.               Discharge Care Instructions  (From  admission, onward)           Start     Ordered   09/08/22 0000  Discharge wound care:       Comments: Remove honeycomb dressing on 09/12/22   09/08/22 1032             Signed: Robley Fries 09/08/2022, 10:33 AM

## 2022-09-10 LAB — SURGICAL PATHOLOGY

## 2022-09-18 MED FILL — Sodium Chloride IV Soln 0.9%: INTRAVENOUS | Qty: 5000 | Status: AC

## 2022-09-18 MED FILL — Heparin Sodium (Porcine) Inj 1000 Unit/ML: INTRAMUSCULAR | Qty: 30 | Status: AC

## 2022-11-06 IMAGING — CT CT ABD-PELV W/ CM
2 of 4 series · 16 of 46 positions shown, 18 images · IV contrast (Omnipaque)
Comparison: None.

CLINICAL DATA: Left lower quadrant abdominal pain.

EXAM:
CT ABDOMEN AND PELVIS WITH CONTRAST
TECHNIQUE: Multidetector CT imaging of the abdomen and pelvis was performed
using the standard protocol following bolus administration of
intravenous contrast.
CONTRAST:  100mL OMNIPAQUE IOHEXOL 300 MG/ML  SOLN

[Series 2: axial st · axial · 0.98mm/px · z∈[-486,-6]mm · 13 of 106 slices shown, 15 images]
[im 5/106  soft-tissue]
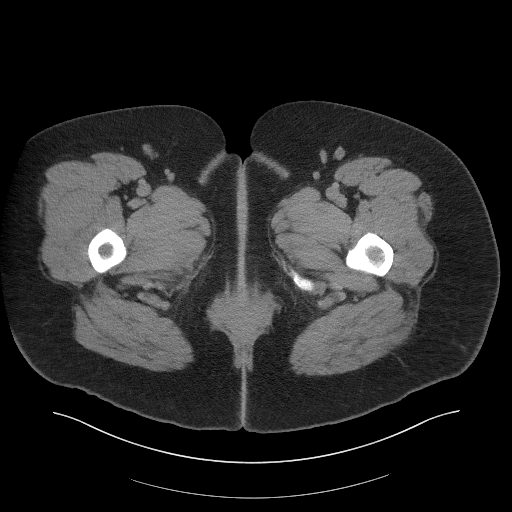
[im 5/106  bone]
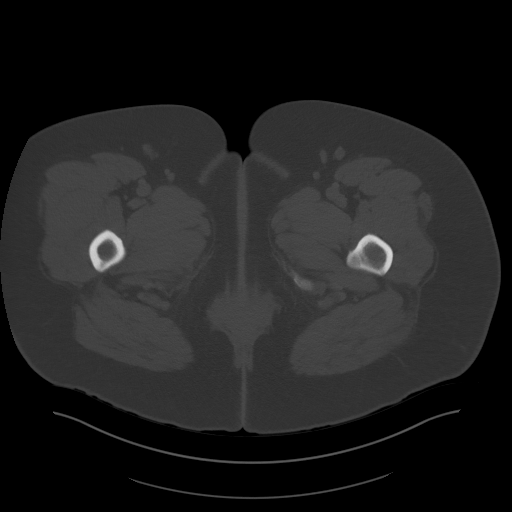
[im 13/106  soft-tissue]
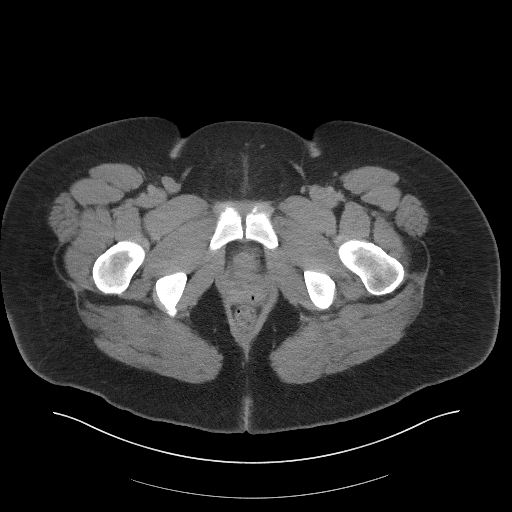
[im 21/106  soft-tissue]
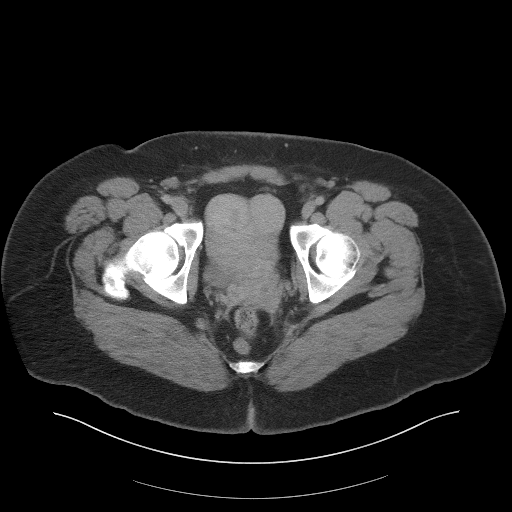
[im 29/106  soft-tissue]
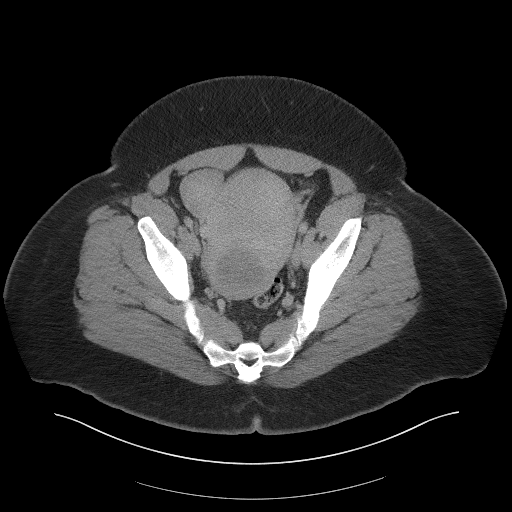
[im 37/106  soft-tissue]
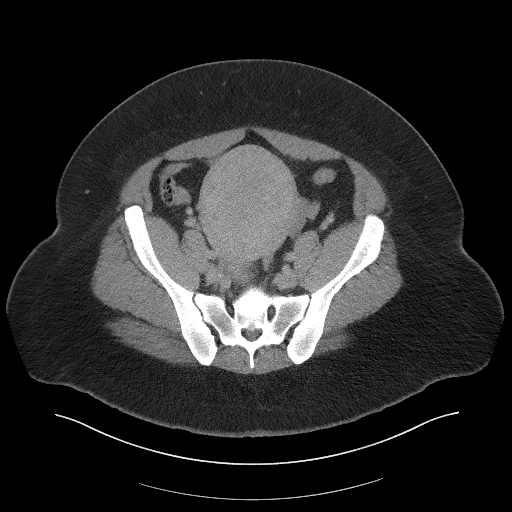
[im 45/106  soft-tissue]
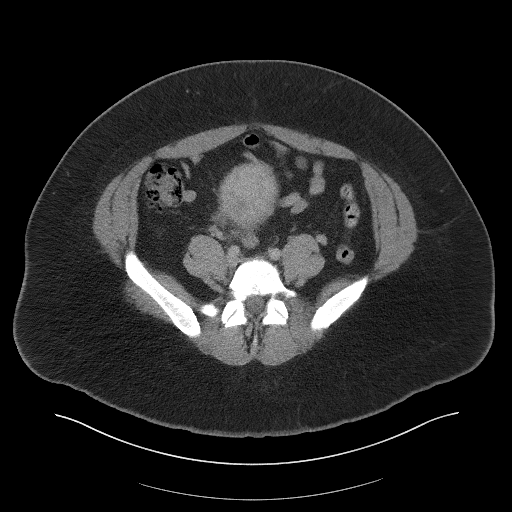
[im 53/106  soft-tissue]
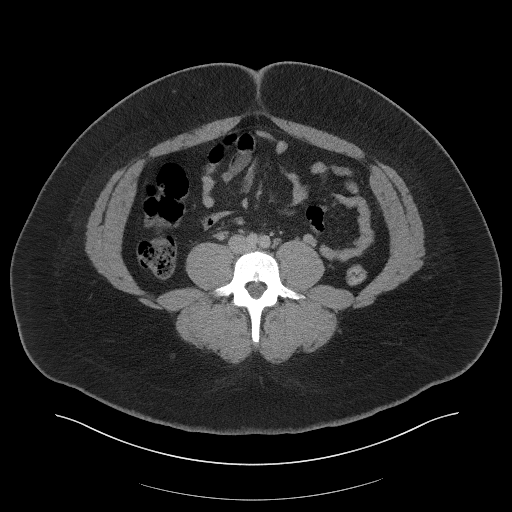
[im 61/106  soft-tissue]
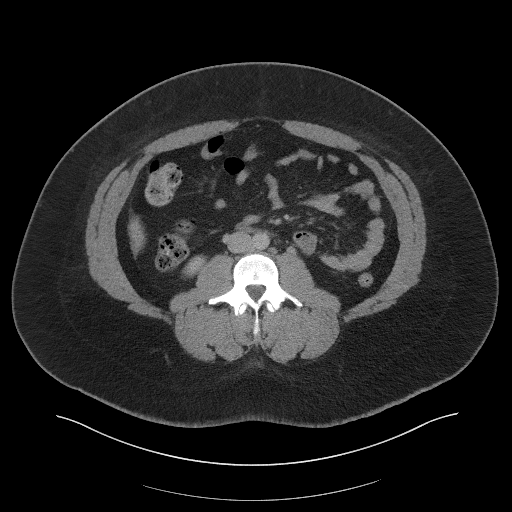
[im 69/106  soft-tissue]
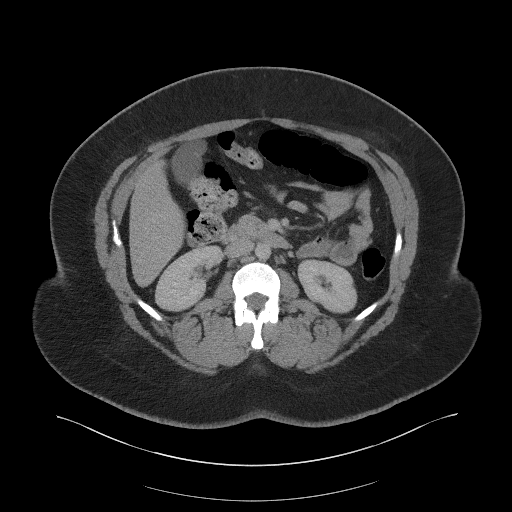
[im 69/106  bone]
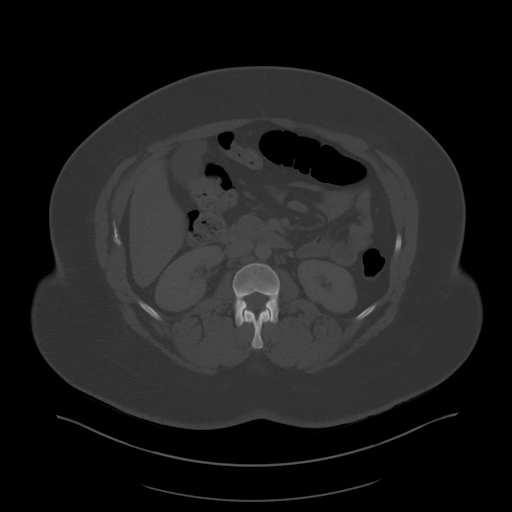
[im 77/106  soft-tissue]
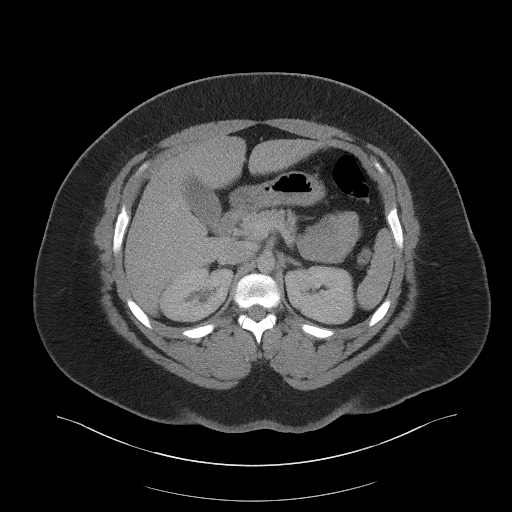
[im 85/106  soft-tissue]
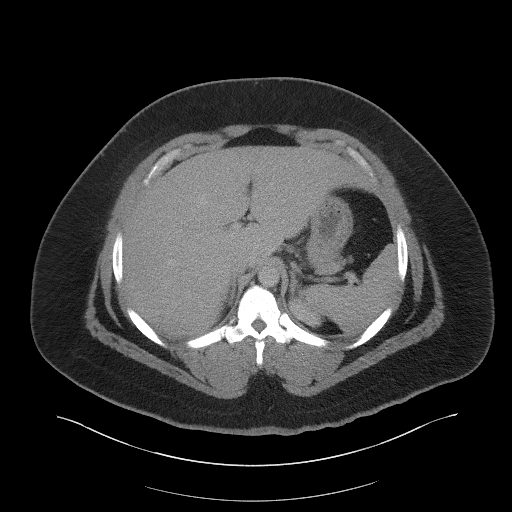
[im 93/106  soft-tissue]
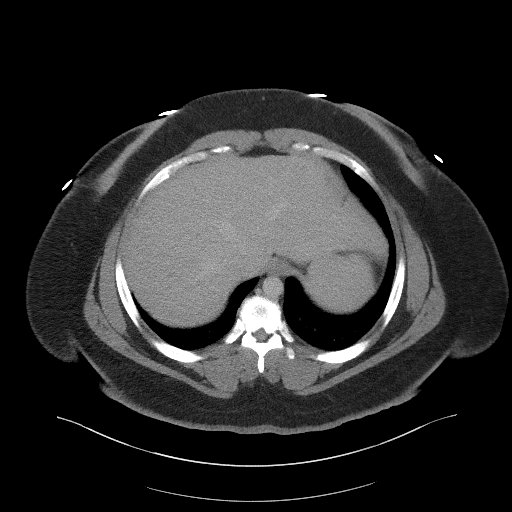
[im 101/106  soft-tissue]
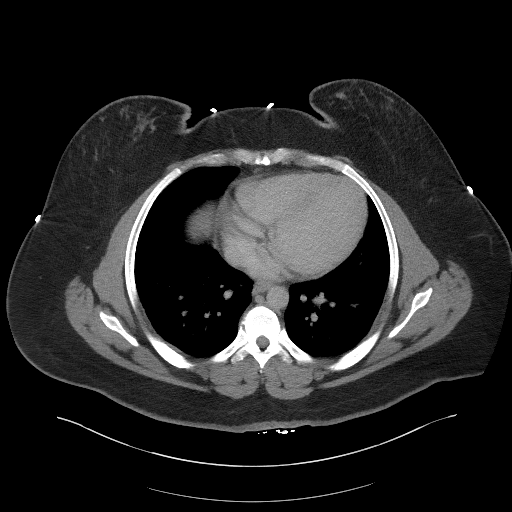

[Series 5: coronal st · coronal · 1.04mm/px · 3 of 119 slices shown]
[im 40/119  soft-tissue]
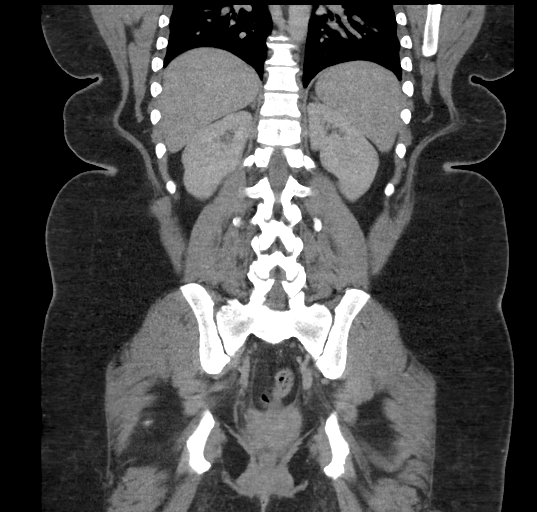
[im 53/119  soft-tissue]
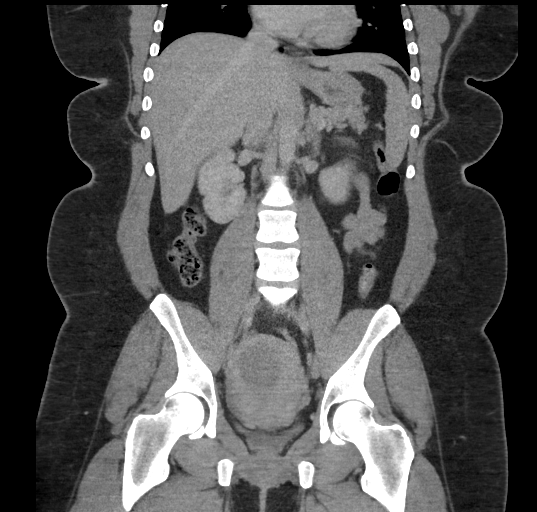
[im 66/119  soft-tissue]
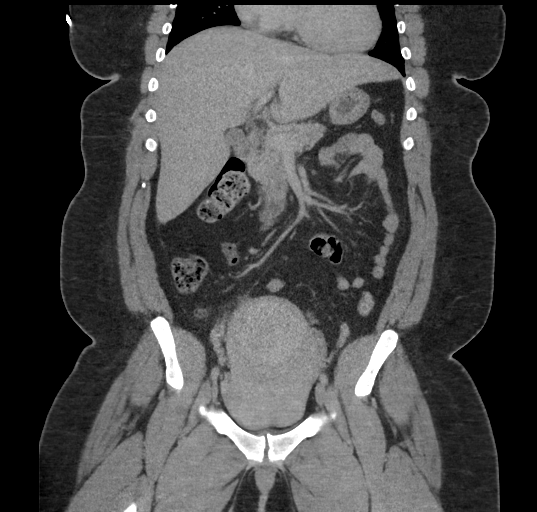

[16 of 46 positions shown; findings below may reference images not displayed]

FINDINGS: Lower chest: No acute abnormality.

Hepatobiliary: No focal liver abnormality is seen. No gallstones,
gallbladder wall thickening, or biliary dilatation.

Pancreas: Unremarkable. No pancreatic ductal dilatation or
surrounding inflammatory changes.

Spleen: Normal in size without focal abnormality.

Adrenals/Urinary Tract: Normal adrenal glands. No hydronephrosis,
kidney stone, or kidney mass identified bilaterally. Urinary bladder
appears collapsed.

Stomach/Bowel: Stomach is within normal limits. Appendix appears
normal. No evidence of bowel wall thickening, distention, or
inflammatory changes.

Vascular/Lymphatic: No significant vascular findings are present. No
enlarged abdominal or pelvic lymph nodes.

Reproductive: There is an enlarged uterus containing multiple
fibroids. The uterus measures 15.8 x 11.8 by 9.4 cm (volume = 920
cm^3). The dominant fibroid has a transverse diameter of 9.4 cm.
Within the posterior myometrium there is a low-attenuation fibroid
measuring 5.2 cm, image 76/2. Suspicious for degenerating fibroid.
Partially exophytic subserosal fibroid arises off the right lateral
myometrium measuring 5.8 cm.

Other: Trace free fluid noted within the cul-de-sac. No focal fluid
collections.

Musculoskeletal: No acute or significant osseous findings. L5-S1
degenerative disc disease.
IMPRESSION: 1. Enlarged fibroid uterus with a volume of approximately 920 cc.
Low-attenuation fibroid within the posterior myometrium may
represent a degenerating fibroid. Degenerating fibroids may be a
source of acute pelvic pain. Clinical correlation advised.
2. No signs of acute diverticulitis.
3. Enlarged uterus containing multiple fibroids.
